# Patient Record
Sex: Female | Born: 1990 | Race: Black or African American | Hispanic: No | Marital: Single | Smoking: Former smoker
Health system: Southern US, Community
[De-identification: ages and names within clinical notes are randomized; demographics above are authoritative.]

## PROBLEM LIST (undated history)

## (undated) ENCOUNTER — Inpatient Hospital Stay: Payer: Self-pay

---
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
## 2008-03-14 ENCOUNTER — Ambulatory Visit: Payer: Self-pay | Admitting: Certified Nurse Midwife

## 2008-05-12 ENCOUNTER — Observation Stay: Payer: Self-pay | Admitting: Obstetrics and Gynecology

## 2008-05-13 ENCOUNTER — Inpatient Hospital Stay: Payer: Self-pay | Admitting: Obstetrics and Gynecology

## 2016-09-15 NOTE — L&D Delivery Note (Signed)
Delivery Note At  2325 a viable and healthy female "Teresa Kemp"  was delivered via  (Presentation:ROA ;  ).  APGAR: 8, 9  .   Placenta status: delivered intact with 3 vessel  Cord:  with the following complications: none  Anesthesia:  epidural Episiotomy:  none Lacerations:  none Est. Blood Loss (mL):  200  Mom to postpartum.  Baby to Couplet care / Skin to Skin.  Melody N Shambley 06/10/2017, 11:36 PM

## 2016-12-30 LAB — OB RESULTS CONSOLE VARICELLA ZOSTER ANTIBODY, IGG: Varicella: IMMUNE

## 2016-12-30 LAB — OB RESULTS CONSOLE ABO/RH: RH TYPE: POSITIVE

## 2016-12-30 LAB — OB RESULTS CONSOLE HGB/HCT, BLOOD
HCT: 34
Hemoglobin: 11.5

## 2016-12-30 LAB — OB RESULTS CONSOLE GC/CHLAMYDIA
CHLAMYDIA, DNA PROBE: NEGATIVE
GC PROBE AMP, GENITAL: NEGATIVE

## 2016-12-30 LAB — OB RESULTS CONSOLE HIV ANTIBODY (ROUTINE TESTING): HIV: NONREACTIVE

## 2016-12-30 LAB — OB RESULTS CONSOLE PLATELET COUNT: PLATELETS: 380

## 2016-12-30 LAB — OB RESULTS CONSOLE ANTIBODY SCREEN: Antibody Screen: NEGATIVE

## 2016-12-30 LAB — OB RESULTS CONSOLE RUBELLA ANTIBODY, IGM: Rubella: NON-IMMUNE/NOT IMMUNE

## 2017-02-20 ENCOUNTER — Encounter: Payer: Self-pay | Admitting: *Deleted

## 2017-02-20 ENCOUNTER — Observation Stay
Admission: EM | Admit: 2017-02-20 | Discharge: 2017-02-20 | Disposition: A | Discharge status: Home / Self Care | Payer: Medicaid Other | Attending: Obstetrics & Gynecology | Admitting: Obstetrics & Gynecology

## 2017-02-20 DIAGNOSIS — O4702 False labor before 37 completed weeks of gestation, second trimester: Secondary | ICD-10-CM | POA: Diagnosis not present

## 2017-02-20 DIAGNOSIS — R252 Cramp and spasm: Secondary | ICD-10-CM | POA: Diagnosis present

## 2017-02-20 DIAGNOSIS — Z3A24 24 weeks gestation of pregnancy: Secondary | ICD-10-CM | POA: Diagnosis not present

## 2017-02-20 LAB — FETAL FIBRONECTIN: Fetal Fibronectin: NEGATIVE

## 2017-02-20 MED ORDER — LACTATED RINGERS IV BOLUS (SEPSIS)
1000.0000 mL | Freq: Once | INTRAVENOUS | Status: AC
  Administered 2017-02-20: 1000 mL via INTRAVENOUS

## 2017-02-21 NOTE — Discharge Summary (Signed)
Wyatt Mageabitha Brynda RimM Joswick is a 26 y.o. female. She is at 9829w3d gestation. Patient's last menstrual period was 09/03/2016. Estimated Date of Delivery: 06/10/17  Prenatal care site:  [facility]   Chief complaint: cramping  Location: gravid uterus Onset/timing: yesterday Duration: 24hrs Quality: crampy Severity: mild Aggravating or alleviating conditions: nothing Associated signs/symptoms: no VB.no LOF,  Active fetal movement. Context: high risk pregnancy with history of 30wk delivery last pregnancy due to unknown etiology and patient declined 17-P intervention this pregnancy due to transportation issues.  Insufficient prenatal care History of preterm birth Marijuana use Elevated 1hr normal 3hr Rubella non-immune  Maternal Medical History:  No past medical history on file.  No past surgical history on file.  No Known Allergies  Prior to Admission medications   Not on File     Social History: +mj and    Family History: no history of gyn cancers  Review of Systems: A full review of systems was performed and negative except as noted in the HPI.     O:  Temp 98.1 F (36.7 C) (Oral)   Resp 16   LMP 09/03/2016  Results for orders placed or performed during the hospital encounter of 02/20/17 (from the past 48 hour(s))  Fetal fibronectin   Collection Time: 02/20/17 11:41 AM  Result Value Ref Range   Fetal Fibronectin NEGATIVE NEGATIVE     Constitutional: NAD, AAOx3  HE/ENT: extraocular movements grossly intact, moist mucous membranes CV: RRR PULM: nl respiratory effort, CTABL     Abd: gravid, non-tender, non-distended, soft      Ext: Non-tender, Nonedmeatous   Psych: mood appropriate, speech normal Pelvic closed/long/high by RN Craige CottaKirby  NST:  Baseline: 145 Variability: moderate Accelerations present x >2 (10x10 @ 24wks) Decelerations absent Time 20mins    A/P: 26 y.o. 6815w2d here for rule out preterm labor  Labor: not present. Fetal fibronectin negative  Fetal  Wellbeing: Reassuring Cat 1 tracing.  Reactive NST   D/c home stable, precautions reviewed, follow-up as scheduled.   ----- Ranae Plumberhelsea Ward, MD Attending Obstetrician and Gynecologist [facility], Department of OB/GYN [facility]

## 2017-03-27 ENCOUNTER — Ambulatory Visit (INDEPENDENT_AMBULATORY_CARE_PROVIDER_SITE_OTHER): Payer: Medicaid Other | Admitting: Certified Nurse Midwife

## 2017-03-27 ENCOUNTER — Encounter: Payer: Self-pay | Admitting: Certified Nurse Midwife

## 2017-03-27 VITALS — BP 126/75 | HR 98 | Ht 66.0 in | Wt 195.4 lb

## 2017-03-27 DIAGNOSIS — Z3493 Encounter for supervision of normal pregnancy, unspecified, third trimester: Secondary | ICD-10-CM

## 2017-03-27 LAB — POCT URINALYSIS DIPSTICK
Bilirubin, UA: NEGATIVE
Blood, UA: NEGATIVE
GLUCOSE UA: NEGATIVE
Ketones, UA: NEGATIVE
LEUKOCYTES UA: NEGATIVE
NITRITE UA: NEGATIVE
Protein, UA: NEGATIVE
Spec Grav, UA: 1.01 (ref 1.010–1.025)
UROBILINOGEN UA: 0.2 U/dL
pH, UA: 7 (ref 5.0–8.0)

## 2017-03-27 NOTE — Patient Instructions (Signed)

## 2017-03-27 NOTE — Progress Notes (Signed)
Pt is here for an OB visit.States she just transferred here [facility].

## 2017-03-27 NOTE — Progress Notes (Addendum)
TRANSFER IN OB HISTORY AND PHYSICAL  SUBJECTIVE:       Teresa Kemp is a 26 y.o. [id number] female, Patient's last menstrual period was 09/03/2016 (exact date)., Estimated Date of Delivery: 06/10/17, 5254w2d, presents today for Transition of Prenatal Care. EPIC data migration from outside records is accomplished today. Complaints today include: none   Gynecologic History Patient's last menstrual period was 09/03/2016 (exact date). Normal Contraception: none Last Pap: 01/29/2017. Results were: unknown. Need records  Obstetric History OB History  Gravida Para Term Preterm AB Living  2 1 0 1   1  SAB TAB Ectopic Multiple Live Births          1    # Outcome Date GA Lbr Len/2nd Weight Sex Delivery Anes PTL Lv  2 Current           1 Preterm 2009   3 lb 7 oz (1.559 kg) M Vag-Spont  Y LIV      History reviewed. No pertinent past medical history.  History reviewed. No pertinent surgical history.  No current outpatient prescriptions on file prior to visit.   No current facility-administered medications on file prior to visit.     No Known Allergies  Social History   Social History  . Marital status: Single    Spouse name: N/A  . Number of children: N/A  . Years of education: N/A   Occupational History  . Not on file.   Social History Main Topics  . Smoking status: Former Games developermoker  . Smokeless tobacco: Never Used  . Alcohol use No  . Drug use: No  . Sexual activity: Yes    Birth control/ protection: None   Other Topics Concern  . Not on file   Social History Narrative  . No narrative on file    Family History  Problem Relation Age of Onset  . Diabetes Father   . Diabetes Brother   . Diabetes Paternal Uncle   . Diabetes Paternal Grandmother   . Stroke Paternal Grandfather   . Diabetes Paternal Grandfather     The following portions of the patient's history were reviewed and updated as appropriate: allergies, current medications, past OB history, past medical  history, past surgical history, past family history, past social history, and problem list.   Pt states that she is no longer smoking or using marijuana .    OBJECTIVE: Initial Physical Exam (New OB)  GENERAL APPEARANCE: alert, well appearing, in no apparent distress, oriented to person, place and time HEAD: normocephalic, atraumatic MOUTH: mucous membranes moist, pharynx normal without lesions THYROID: no thyromegaly or masses present BREASTS: no masses noted, no significant tenderness, no palpable axillary nodes, no skin changes LUNGS: clear to auscultation, no wheezes, rales or rhonchi, symmetric air entry HEART: regular rate and rhythm, no murmurs ABDOMEN: soft, nontender, nondistended, no abnormal masses, no epigastric pain EXTREMITIES: no redness or tenderness in the calves or thighs SKIN: normal coloration and turgor, no rashes LYMPH NODES: no adenopathy palpable NEUROLOGIC: alert, oriented, normal speech, no focal findings or movement disorder noted  PELVIC EXAM EXTERNAL GENITALIA: not indicated at this time   LABS:  A pos Antibody negative Rubella -non immune HIV negative Quad screen: negative Urine culture negative GC/Chlamydia negative HBsAg negative Varicella Zoster immune sickle cell negative  Need: pap results.   ASSESSMENT: Normal pregnancy  PLAN: Prenatal care course reviewed. Pt requesting ultrasound for pictures. Explained that it is not medically necessary. Information given on prenatal peak. Discussed use of 17 P  due to hx. Of preterm delivery. She is declining at this time due to transportation issues. Marylyn from pregnancy home informed to help identify additional resources to get her to the office weekly.  Prenatal records from the health department reviewed. Some of the Lab results are not present with records. Will request lab results. PTL precaution reviewed. Follow up 2 wks.   Doreene Burke, CNM  See orders

## 2017-04-10 ENCOUNTER — Encounter: Payer: Self-pay | Admitting: Certified Nurse Midwife

## 2017-04-10 ENCOUNTER — Ambulatory Visit (INDEPENDENT_AMBULATORY_CARE_PROVIDER_SITE_OTHER): Payer: Medicaid Other | Admitting: Certified Nurse Midwife

## 2017-04-10 VITALS — BP 133/85 | HR 107 | Wt 201.2 lb

## 2017-04-10 DIAGNOSIS — Z87898 Personal history of other specified conditions: Secondary | ICD-10-CM

## 2017-04-10 DIAGNOSIS — F1291 Cannabis use, unspecified, in remission: Secondary | ICD-10-CM | POA: Insufficient documentation

## 2017-04-10 DIAGNOSIS — O0933 Supervision of pregnancy with insufficient antenatal care, third trimester: Secondary | ICD-10-CM | POA: Insufficient documentation

## 2017-04-10 DIAGNOSIS — Z2839 Other underimmunization status: Secondary | ICD-10-CM | POA: Insufficient documentation

## 2017-04-10 DIAGNOSIS — Z3493 Encounter for supervision of normal pregnancy, unspecified, third trimester: Secondary | ICD-10-CM

## 2017-04-10 DIAGNOSIS — O99810 Abnormal glucose complicating pregnancy: Secondary | ICD-10-CM

## 2017-04-10 DIAGNOSIS — O9989 Other specified diseases and conditions complicating pregnancy, childbirth and the puerperium: Secondary | ICD-10-CM

## 2017-04-10 DIAGNOSIS — Z23 Encounter for immunization: Secondary | ICD-10-CM | POA: Diagnosis not present

## 2017-04-10 DIAGNOSIS — O09893 Supervision of other high risk pregnancies, third trimester: Secondary | ICD-10-CM

## 2017-04-10 DIAGNOSIS — O09219 Supervision of pregnancy with history of pre-term labor, unspecified trimester: Secondary | ICD-10-CM

## 2017-04-10 DIAGNOSIS — O09899 Supervision of other high risk pregnancies, unspecified trimester: Secondary | ICD-10-CM | POA: Insufficient documentation

## 2017-04-10 DIAGNOSIS — Z8659 Personal history of other mental and behavioral disorders: Secondary | ICD-10-CM

## 2017-04-10 DIAGNOSIS — Z283 Underimmunization status: Secondary | ICD-10-CM

## 2017-04-10 DIAGNOSIS — O09213 Supervision of pregnancy with history of pre-term labor, third trimester: Secondary | ICD-10-CM

## 2017-04-10 LAB — POCT URINALYSIS DIPSTICK
Bilirubin, UA: NEGATIVE
Blood, UA: NEGATIVE
GLUCOSE UA: NEGATIVE
Ketones, UA: NEGATIVE
Leukocytes, UA: NEGATIVE
NITRITE UA: NEGATIVE
Protein, UA: NEGATIVE
Spec Grav, UA: 1.025 (ref 1.010–1.025)
UROBILINOGEN UA: 0.2 U/dL
pH, UA: 5 (ref 5.0–8.0)

## 2017-04-10 NOTE — Progress Notes (Signed)
Pt is here for an ROB visit. C/o cramps. Tdap given

## 2017-04-10 NOTE — Patient Instructions (Addendum)
Third Trimester of Pregnancy The third trimester is from week 28 through week 40 (months 7 through 9). The third trimester is a time when the unborn baby (fetus) is growing rapidly. At the end of the ninth month, the fetus is about 20 inches in length and weighs 6-10 pounds. Body changes during your third trimester Your body will continue to go through many changes during pregnancy. The changes vary from woman to woman. During the third trimester:  Your weight will continue to increase. You can expect to gain 25-35 pounds (11-16 kg) by the end of the pregnancy.  You may begin to get stretch marks on your hips, abdomen, and breasts.  You may urinate more often because the fetus is moving lower into your pelvis and pressing on your bladder.  You may develop or continue to have heartburn. This is caused by increased hormones that slow down muscles in the digestive tract.  You may develop or continue to have constipation because increased hormones slow digestion and cause the muscles that push waste through your intestines to relax.  You may develop hemorrhoids. These are swollen veins (varicose veins) in the rectum that can itch or be painful.  You may develop swollen, bulging veins (varicose veins) in your legs.  You may have increased body aches in the pelvis, back, or thighs. This is due to weight gain and increased hormones that are relaxing your joints.  You may have changes in your hair. These can include thickening of your hair, rapid growth, and changes in texture. Some women also have hair loss during or after pregnancy, or hair that feels dry or thin. Your hair will most likely return to normal after your baby is born.  Your breasts will continue to grow and they will continue to become tender. A yellow fluid (colostrum) may leak from your breasts. This is the first milk you are producing for your baby.  Your belly button may stick out.  You may notice more swelling in your hands,  face, or ankles.  You may have increased tingling or numbness in your hands, arms, and legs. The skin on your belly may also feel numb.  You may feel short of breath because of your expanding uterus.  You may have more problems sleeping. This can be caused by the size of your belly, increased need to urinate, and an increase in your body's metabolism.  You may notice the fetus "dropping," or moving lower in your abdomen (lightening).  You may have increased vaginal discharge.  You may notice your joints feel loose and you may have pain around your pelvic bone.  What to expect at prenatal visits You will have prenatal exams every 2 weeks until week 36. Then you will have weekly prenatal exams. During a routine prenatal visit:  You will be weighed to make sure you and the baby are growing normally.  Your blood pressure will be taken.  Your abdomen will be measured to track your baby's growth.  The fetal heartbeat will be listened to.  Any test results from the previous visit will be discussed.  You may have a cervical check near your due date to see if your cervix has softened or thinned (effaced).  You will be tested for Group B streptococcus. This happens between 35 and 37 weeks.  Your health care provider may ask you:  What your birth plan is.  How you are feeling.  If you are feeling the baby move.  If you have had   any abnormal symptoms, such as leaking fluid, bleeding, severe headaches, or abdominal cramping.  If you are using any tobacco products, including cigarettes, chewing tobacco, and electronic cigarettes.  If you have any questions.  Other tests or screenings that may be performed during your third trimester include:  Blood tests that check for low iron levels (anemia).  Fetal testing to check the health, activity level, and growth of the fetus. Testing is done if you have certain medical conditions or if there are problems during the  pregnancy.  Nonstress test (NST). This test checks the health of your baby to make sure there are no signs of problems, such as the baby not getting enough oxygen. During this test, a belt is placed around your belly. The baby is made to move, and its heart rate is monitored during movement.  What is false labor? False labor is a condition in which you feel small, irregular tightenings of the muscles in the womb (contractions) that usually go away with rest, changing position, or drinking water. These are called Braxton Hicks contractions. Contractions may last for hours, days, or even weeks before true labor sets in. If contractions come at regular intervals, become more frequent, increase in intensity, or become painful, you should see your health care provider. What are the signs of labor?  Abdominal cramps.  Regular contractions that start at 10 minutes apart and become stronger and more frequent with time.  Contractions that start on the top of the uterus and spread down to the lower abdomen and back.  Increased pelvic pressure and dull back pain.  A watery or bloody mucus discharge that comes from the vagina.  Leaking of amniotic fluid. This is also known as your "water breaking." It could be a slow trickle or a gush. Let your health care provider know if it has a color or strange odor. If you have any of these signs, call your health care provider right away, even if it is before your due date. Follow these instructions at home: Medicines  Follow your health care provider's instructions regarding medicine use. Specific medicines may be either safe or unsafe to take during pregnancy.  Take a prenatal vitamin that contains at least 600 micrograms (mcg) of folic acid.  If you develop constipation, try taking a stool softener if your health care provider approves. Eating and drinking  Eat a balanced diet that includes fresh fruits and vegetables, whole grains, good sources of protein  such as meat, eggs, or tofu, and low-fat dairy. Your health care provider will help you determine the amount of weight gain that is right for you.  Avoid raw meat and uncooked cheese. These carry germs that can cause birth defects in the baby.  If you have low calcium intake from food, talk to your health care provider about whether you should take a daily calcium supplement.  Eat four or five small meals rather than three large meals a day.  Limit foods that are high in fat and processed sugars, such as fried and sweet foods.  To prevent constipation: ? Drink enough fluid to keep your urine clear or pale yellow. ? Eat foods that are high in fiber, such as fresh fruits and vegetables, whole grains, and beans. Activity  Exercise only as directed by your health care provider. Most women can continue their usual exercise routine during pregnancy. Try to exercise for 30 minutes at least 5 days a week. Stop exercising if you experience uterine contractions.  Avoid heavy   lifting.  Do not exercise in extreme heat or humidity, or at high altitudes.  Wear low-heel, comfortable shoes.  Practice good posture.  You may continue to have sex unless your health care provider tells you otherwise. Relieving pain and discomfort  Take frequent breaks and rest with your legs elevated if you have leg cramps or low back pain.  Take warm sitz baths to soothe any pain or discomfort caused by hemorrhoids. Use hemorrhoid cream if your health care provider approves.  Wear a good support bra to prevent discomfort from breast tenderness.  If you develop varicose veins: ? Wear support pantyhose or compression stockings as told by your healthcare provider. ? Elevate your feet for 15 minutes, 3-4 times a day. Prenatal care  Write down your questions. Take them to your prenatal visits.  Keep all your prenatal visits as told by your health care provider. This is important. Safety  Wear your seat belt at  all times when driving.  Make a list of emergency phone numbers, including numbers for family, friends, the hospital, and police and fire departments. General instructions  Avoid cat litter boxes and soil used by cats. These carry germs that can cause birth defects in the baby. If you have a cat, ask someone to clean the litter box for you.  Do not travel far distances unless it is absolutely necessary and only with the approval of your health care provider.  Do not use hot tubs, steam rooms, or saunas.  Do not drink alcohol.  Do not use any products that contain nicotine or tobacco, such as cigarettes and e-cigarettes. If you need help quitting, ask your health care provider.  Do not use any medicinal herbs or unprescribed drugs. These chemicals affect the formation and growth of the baby.  Do not douche or use tampons or scented sanitary pads.  Do not cross your legs for long periods of time.  To prepare for the arrival of your baby: ? Take prenatal classes to understand, practice, and ask questions about labor and delivery. ? Make a trial run to the hospital. ? Visit the hospital and tour the maternity area. ? Arrange for maternity or paternity leave through employers. ? Arrange for family and friends to take care of pets while you are in the hospital. ? Purchase a rear-facing car seat and make sure you know how to install it in your car. ? Pack your hospital bag. ? Prepare the baby's nursery. Make sure to remove all pillows and stuffed animals from the baby's crib to prevent suffocation.  Visit your dentist if you have not gone during your pregnancy. Use a soft toothbrush to brush your teeth and be gentle when you floss. Contact a health care provider if:  You are unsure if you are in labor or if your water has broken.  You become dizzy.  You have mild pelvic cramps, pelvic pressure, or nagging pain in your abdominal area.  You have lower back pain.  You have persistent  nausea, vomiting, or diarrhea.  You have an unusual or bad smelling vaginal discharge.  You have pain when you urinate. Get help right away if:  Your water breaks before 37 weeks.  You have regular contractions less than 5 minutes apart before 37 weeks.  You have a fever.  You are leaking fluid from your vagina.  You have spotting or bleeding from your vagina.  You have severe abdominal pain or cramping.  You have rapid weight loss or weight gain.    You have shortness of breath with chest pain.  You notice sudden or extreme swelling of your face, hands, ankles, feet, or legs.  Your baby makes fewer than 10 movements in 2 hours.  You have severe headaches that do not go away when you take medicine.  You have vision changes. Summary  The third trimester is from week 28 through week 40, months 7 through 9. The third trimester is a time when the unborn baby (fetus) is growing rapidly.  During the third trimester, your discomfort may increase as you and your baby continue to gain weight. You may have abdominal, leg, and back pain, sleeping problems, and an increased need to urinate.  During the third trimester your breasts will keep growing and they will continue to become tender. A yellow fluid (colostrum) may leak from your breasts. This is the first milk you are producing for your baby.  False labor is a condition in which you feel small, irregular tightenings of the muscles in the womb (contractions) that eventually go away. These are called Braxton Hicks contractions. Contractions may last for hours, days, or even weeks before true labor sets in.  Signs of labor can include: abdominal cramps; regular contractions that start at 10 minutes apart and become stronger and more frequent with time; watery or bloody mucus discharge that comes from the vagina; increased pelvic pressure and dull back pain; and leaking of amniotic fluid. This information is not intended to replace advice  given to you by your health care provider. Make sure you discuss any questions you have with your health care provider. Document Released: 08/26/2001 Document Revised: 02/07/2016 Document Reviewed: 11/02/2012 Elsevier Interactive Patient Education  2017 Elsevier Inc. DTaP Vaccine (Diphtheria, Tetanus, and Pertussis): What You Need to Know 1. Why get vaccinated? Diphtheria, tetanus, and pertussis are serious diseases caused by bacteria. Diphtheria and pertussis are spread from person to person. Tetanus enters the body through cuts or wounds. DIPHTHERIA causes a thick covering in the back of the throat.  It can lead to breathing problems, paralysis, heart failure, and even death.  TETANUS (Lockjaw) causes painful tightening of the muscles, usually all over the body.  It can lead to "locking" of the jaw so the victim cannot open his mouth or swallow. Tetanus leads to death in up to 2 out of 10 cases.  PERTUSSIS (Whooping Cough) causes coughing spells so bad that it is hard for infants to eat, drink, or breathe. These spells can last for weeks.  It can lead to pneumonia, seizures (jerking and staring spells), brain damage, and death.  Diphtheria, tetanus, and pertussis vaccine (DTaP) can help prevent these diseases. Most children who are vaccinated with DTaP will be protected throughout childhood. Many more children would get these diseases if we stopped vaccinating. DTaP is a safer version of an older vaccine called DTP. DTP is no longer used in the [place]. 2. Who should get DTaP vaccine and when? Children should get 5 doses of DTaP vaccine, one dose at each of the following ages:  2 months  4 months  6 months  15-18 months  4-6 years  DTaP may be given at the same time as other vaccines. 3. Some children should not get DTaP vaccine or should wait  Children with minor illnesses, such as a cold, may be vaccinated. But children who are moderately or severely ill should usually  wait until they recover before getting DTaP vaccine.  Any child who had a life-threatening allergic reaction after  a dose of DTaP should not get another dose.  Any child who suffered a brain or nervous system disease within 7 days after a dose of DTaP should not get another dose.  Talk with your doctor if your child: ? had a seizure or collapsed after a dose of DTaP, ? cried non-stop for 3 hours or more after a dose of DTaP, ? had a fever over 105F after a dose of DTaP. Ask your doctor for more information. Some of these children should not get another dose of pertussis vaccine, but may get a vaccine without pertussis, called DT. 4. Older children and adults DTaP is not licensed for adolescents, adults, or children 59 years of age and older. But older people still need protection. A vaccine called Tdap is similar to DTaP. A single dose of Tdap is recommended for people 11 through 28 years of age. Another vaccine, called Td, protects against tetanus and diphtheria, but not pertussis. It is recommended every 10 years. There are separate Vaccine Information Statements for these vaccines. 5. What are the risks from DTaP vaccine? Getting diphtheria, tetanus, or pertussis disease is much riskier than getting DTaP vaccine. However, a vaccine, like any medicine, is capable of causing serious problems, such as severe allergic reactions. The risk of DTaP vaccine causing serious harm, or death, is extremely small. Mild problems (common)  Fever (up to about 1 child in 4)  Redness or swelling where the shot was given (up to about 1 child in 4)  Soreness or tenderness where the shot was given (up to about 1 child in 4) These problems occur more often after the 4th and 5th doses of the DTaP series than after earlier doses. Sometimes the 4th or 5th dose of DTaP vaccine is followed by swelling of the entire arm or leg in which the shot was given, lasting 1-7 days (up to about 1 child in 30). Other mild  problems include:  Fussiness (up to about 1 child in 3)  Tiredness or poor appetite (up to about 1 child in 10)  Vomiting (up to about 1 child in 50) These problems generally occur 1-3 days after the shot. Moderate problems (uncommon)  Seizure (jerking or staring) (about 1 child out of 14,000)  Non-stop crying, for 3 hours or more (up to about 1 child out of 1,000)  High fever, over 105F (about 1 child out of 16,000) Severe problems (very rare)  Serious allergic reaction (less than 1 out of a million doses)  Several other severe problems have been reported after DTaP vaccine. These include: ? Long-term seizures, coma, or lowered consciousness ? Permanent brain damage. These are so rare it is hard to tell if they are caused by the vaccine. Controlling fever is especially important for children who have had seizures, for any reason. It is also important if another family member has had seizures. You can reduce fever and pain by giving your child an aspirin-free pain reliever when the shot is given, and for the next 24 hours, following the package instructions. 6. What if there is a serious reaction? What should I look for? Look for anything that concerns you, such as signs of a severe allergic reaction, very high fever, or behavior changes. Signs of a severe allergic reaction can include hives, swelling of the face and throat, difficulty breathing, a fast heartbeat, dizziness, and weakness. These would start a few minutes to a few hours after the vaccination. What should I do?  If you think  it is a severe allergic reaction or other emergency that can't wait, call 9-1-1 or get the person to the nearest hospital. Otherwise, call your doctor.  Afterward, the reaction should be reported to the Vaccine Adverse Event Reporting System (VAERS). Your doctor might file this report, or you can do it yourself through the VAERS web site at www.vaers.LAgents.nohhs.gov, or by calling 1-(323)174-4167. ? VAERS  is only for reporting reactions. They do not give medical advice. 7. The National Vaccine Injury Compensation Program The [employer] Vaccine Injury Compensation Program (VICP) is a federal program that was created to compensate people who may have been injured by certain vaccines. Persons who believe they may have been injured by a vaccine can learn about the program and about filing a claim by calling 1-226-813-9935 or visiting the VICP website at SpiritualWord.atwww.hrsa.gov/vaccinecompensation. 8. How can I learn more?  Ask your doctor.  Call your local or state health department.  Contact the Centers for Disease Control and Prevention (CDC): ? Call 417-856-42271-757-707-8274 (1-800-CDC-INFO) or ? Visit CDC's website at PicCapture.uywww.cdc.gov/vaccines CDC DTaP Vaccine (Diphtheria, Tetanus, and Pertussis) VIS (01/29/06) This information is not intended to replace advice given to you by your health care provider. Make sure you discuss any questions you have with your health care provider. Document Released: 06/29/2006 Document Revised: 05/22/2016 Document Reviewed: 05/22/2016 Elsevier Interactive Patient Education  2017 [employer] [facility] 2018 Prenatal Education Class Schedule Register at [email or web address] in the Classes & Resources Link or call Mardi Mainland[facility]Well Line at [phone] 9:00a-5:00p M-F  Childbirth Preparation Certified Childbirth Educators teach this 5 week course.  Expectant parents are encouraged to take this class in their 3rd trimester, completing it by their 35-36th week. Meets in [facility], Lower Level.  Mondays Thursdays  7:00-9:00 p 7:00-9:00 p  July 23 - August 20 July 19 - August 16  September 17 - October 15 September 6 -October 4  November 5 - December 3 October 25 - November 29   No Class on Thanksgiving Day -November 22  Childbirth Preparation Refresher Course For those who have previously attended Prepared Childbirth Preparation classes, this class in incorporated  into the 3rd and 4th classes in the Monday night childbirth series.  Course meets in the [facility]. Lower Level from 7:00p - 9:00p  August 6 & 13  October 1 & 8  November 19 & 26   Weekend Childbirth [facility] Classes are held Saturday & Sunday, 1:00 5:00p Course meets in [facility], [place]  August 4 & 5  November 3 & 4    The [address] [facility] Free tours are held on the third Sunday of each month at 3 pm.  The tour meets in the third floor waiting area and will take approximately 30 minutes.  Tours are also included in Childbirth class series as well as Brother/Sister class.  An online virtual tour can be seen at [email or web address].         Breastfeeding & Infant Nutrition The course incorporates returning to work or school.  Breast milk collection and storage with basic breastfeeding and infant nutrition. This two-class course is held the 2nd and 3rd Tuesday of each month from 7:00 -9:00 pm.  Course meets in the [facility] Lower level  June 12 & 19 July-No Class  August 14 & 21 September 11 &18  September 11 & 18 October 9 &16  November 13 & 20 December 11 & 18   [facility] [facility] [employer] welcomes any mother  for a social outing with other Moms to share experiences and challenges in an informal setting.  Meets the 1st Thursday and 3rd Thursday 11:30a-1:00 pm of each month in [facility] 3rd floor classroom.  No registration required.  Newborn Essentials This course covers bathing, diapering, swaddling and more with practice on lifelike dolls.  Participants will also learn safety tips and infant CPR (Not for certification).  It is held the 1st Wednesday of each month from 7:00p-9:00p in the [facility], Lower level.  June 6 July- No Class  August 1 September 5  October 3 November 7  December 7    Preparing Big Brother & Sister This one session course prepares children and their parents for the arrival of a new baby.  It is held on  the 1st Tuesday of each month from 6:30p - 8:00p. Course meets in the [facility], Lower level.  July-No Class August 7  September 4 October 2  November 6 December 4   [facility] [employer] D[employer] This nationally acclaimed class helps expecting and new dads with the basic skills and confidence to bond with their infants, support their mates, and provide a safe and healthy home environment for their new family. Classes are held the 2nd Saturday of every month from 9:00a - 12:00 noon.  Course meets in the [facility] Lower level.  June 9 August 11  October 13 No Class in December

## 2017-04-10 NOTE — Progress Notes (Signed)
ROB-Pt doing well pregnancy wise, argument with FOB in waiting room prior to today's visit. FOB not present for visit. Pt denies safety concerns or fear of going home. TDaP given today. Blood transfusion consent reviewed and signed. Plans breastfeeding and breastfeeding class schedule given. Encouraged used of lactation support. Female and vertex by US. Reviewed red flag symptoms and when to call. RTC x 2 weeks for ROB or sooner if needed.

## 2017-04-24 ENCOUNTER — Encounter: Payer: Medicaid Other | Admitting: Certified Nurse Midwife

## 2017-05-08 ENCOUNTER — Encounter: Payer: Self-pay | Admitting: Certified Nurse Midwife

## 2017-05-08 ENCOUNTER — Ambulatory Visit (INDEPENDENT_AMBULATORY_CARE_PROVIDER_SITE_OTHER): Payer: Medicaid Other | Admitting: Certified Nurse Midwife

## 2017-05-08 VITALS — BP 107/82 | HR 97 | Wt 207.6 lb

## 2017-05-08 DIAGNOSIS — Z3493 Encounter for supervision of normal pregnancy, unspecified, third trimester: Secondary | ICD-10-CM

## 2017-05-08 LAB — POCT URINALYSIS DIPSTICK
Bilirubin, UA: NEGATIVE
Blood, UA: NEGATIVE
GLUCOSE UA: NEGATIVE
KETONES UA: NEGATIVE
Leukocytes, UA: NEGATIVE
Nitrite, UA: NEGATIVE
Protein, UA: NEGATIVE
Urobilinogen, UA: 0.2 E.U./dL
pH, UA: 7.5 (ref 5.0–8.0)

## 2017-05-08 NOTE — Patient Instructions (Signed)
Group B Streptococcus Infection During Pregnancy Group B Streptococcus (GBS) is a type of bacteria (Streptococcus agalactiae) that is often found in healthy people, commonly in the rectum, vagina, and intestines. In people who are healthy and not pregnant, the bacteria rarely cause serious illness or complications. However, women who test positive for GBS during pregnancy can pass the bacteria to their baby during childbirth, which can cause serious infection in the baby after birth. Women with GBS may also have infections during their pregnancy or immediately after childbirth, such as such as urinary tract infections (UTIs) or infections of the uterus (uterine infections). Having GBS also increases a woman's risk of complications during pregnancy, such as early (preterm) labor or delivery, miscarriage, or stillbirth. Routine testing (screening) for GBS is recommended for all pregnant women. What increases the risk? You may have a higher risk for GBS infection during pregnancy if you had one during a past pregnancy. What are the signs or symptoms? In most cases, GBS infection does not cause symptoms in pregnant women. Signs and symptoms of a possible GBS-related infection may include:  Labor starting before the 37th week of pregnancy.  A UTI or bladder infection, which may cause: ? Fever. ? Pain or burning during urination. ? Frequent urination.  Fever during labor, along with: ? Bad-smelling discharge. ? Uterine tenderness. ? Rapid heartbeat in the mother, baby, or both.  Rare but serious symptoms of a possible GBS-related infection in women include:  Blood infection (septicemia). This may cause fever, chills, or confusion.  Lung infection (pneumonia). This may cause fever, chills, cough, rapid breathing, difficulty breathing, or chest pain.  Bone, joint, skin, or soft tissue infection.  How is this diagnosed? You may be screened for GBS between week 35 and week 37 of your pregnancy. If  you have symptoms of preterm labor, you may be screened earlier. This condition is diagnosed based on lab test results from:  A swab of fluid from the vagina and rectum.  A urine sample.  How is this treated? This condition is treated with antibiotic medicine. When you go into labor, or as soon as your water breaks (your membranes rupture), you will be given antibiotics through an IV tube. Antibiotics will continue until after you give birth. If you are having a cesarean delivery, you do not need antibiotics unless your membranes have already ruptured. Follow these instructions at home:  Take over-the-counter and prescription medicines only as told by your health care provider.  Take your antibiotic medicine as told by your health care provider. Do not stop taking the antibiotic even if you start to feel better.  Keep all pre-birth (prenatal) visits and follow-up visits as told by your health care provider. This is important. Contact a health care provider if:  You have pain or burning when you urinate.  You have to urinate frequently.  You have a fever or chills.  You develop a bad-smelling vaginal discharge. Get help right away if:  Your membranes rupture.  You go into labor.  You have severe pain in your abdomen.  You have difficulty breathing.  You have chest pain. This information is not intended to replace advice given to you by your health care provider. Make sure you discuss any questions you have with your health care provider. Document Released: 12/09/2007 Document Revised: 03/28/2016 Document Reviewed: 03/27/2016 Elsevier Interactive Patient Education  2018 [employer]vier Inc[employer]  

## 2017-05-08 NOTE — Progress Notes (Signed)
Pt is here for a routine OB visit. Right leg gets tingly and numb to her ankles.

## 2017-05-08 NOTE — Progress Notes (Signed)
ROB doing well. Complains of back discomfort and some swelling. No swelling today on exam. Discussed use of stocking and belly band. Encouraged PO hydration and tylenol as needed. Note given today for work - frequent breaks. Reviewed GBS at next visit. She will return in one week.  Doreene Burke, CNM

## 2017-05-15 ENCOUNTER — Encounter: Payer: Medicaid Other | Admitting: Certified Nurse Midwife

## 2017-05-19 ENCOUNTER — Ambulatory Visit (INDEPENDENT_AMBULATORY_CARE_PROVIDER_SITE_OTHER): Payer: Medicaid Other | Admitting: Certified Nurse Midwife

## 2017-05-19 ENCOUNTER — Encounter: Payer: Self-pay | Admitting: Certified Nurse Midwife

## 2017-05-19 VITALS — BP 117/79 | HR 79 | Wt 209.3 lb

## 2017-05-19 DIAGNOSIS — Z369 Encounter for antenatal screening, unspecified: Secondary | ICD-10-CM

## 2017-05-19 DIAGNOSIS — Z3493 Encounter for supervision of normal pregnancy, unspecified, third trimester: Secondary | ICD-10-CM

## 2017-05-19 DIAGNOSIS — Z113 Encounter for screening for infections with a predominantly sexual mode of transmission: Secondary | ICD-10-CM

## 2017-05-19 LAB — POCT URINALYSIS DIPSTICK
Bilirubin, UA: NEGATIVE
Blood, UA: NEGATIVE
Glucose, UA: NEGATIVE
KETONES UA: NEGATIVE
LEUKOCYTES UA: NEGATIVE
Nitrite, UA: NEGATIVE
PH UA: 7.5 (ref 5.0–8.0)
PROTEIN UA: NEGATIVE
Spec Grav, UA: 1.01 (ref 1.010–1.025)
Urobilinogen, UA: 0.2 E.U./dL

## 2017-05-19 NOTE — Patient Instructions (Signed)

## 2017-05-19 NOTE — Progress Notes (Signed)
ROB doing well. No complaints. GBS, GC/chlamydia today. SVE per pt request. Labor precautions reviewed. Follow up 1 wk  Doreene BurkeAnnie Damarys Speir, CNM

## 2017-05-20 ENCOUNTER — Other Ambulatory Visit: Payer: Self-pay

## 2017-05-20 NOTE — Addendum Note (Signed)
Addended by: Jackquline DenmarkIDGEWAY, Andalyn Heckstall W on: 05/20/2017 09:05 AM   Modules accepted: Orders

## 2017-05-22 LAB — GC/CHLAMYDIA PROBE AMP
Chlamydia trachomatis, NAA: NEGATIVE
NEISSERIA GONORRHOEAE BY PCR: NEGATIVE

## 2017-05-24 LAB — CULTURE, BETA STREP (GROUP B ONLY): STREP GP B CULTURE: NEGATIVE

## 2017-05-25 ENCOUNTER — Encounter: Payer: Self-pay | Admitting: Certified Nurse Midwife

## 2017-05-26 ENCOUNTER — Encounter: Payer: Self-pay | Admitting: Certified Nurse Midwife

## 2017-05-26 ENCOUNTER — Ambulatory Visit (INDEPENDENT_AMBULATORY_CARE_PROVIDER_SITE_OTHER): Payer: Medicaid Other | Admitting: Certified Nurse Midwife

## 2017-05-26 VITALS — BP 119/74 | HR 85 | Wt 212.0 lb

## 2017-05-26 DIAGNOSIS — Z3493 Encounter for supervision of normal pregnancy, unspecified, third trimester: Secondary | ICD-10-CM

## 2017-05-26 LAB — POCT URINALYSIS DIPSTICK
BILIRUBIN UA: NEGATIVE
Blood, UA: NEGATIVE
Glucose, UA: NEGATIVE
KETONES UA: NEGATIVE
Nitrite, UA: NEGATIVE
PH UA: 7 (ref 5.0–8.0)
Protein, UA: NEGATIVE
Spec Grav, UA: 1.01 (ref 1.010–1.025)
Urobilinogen, UA: 0.2 E.U./dL

## 2017-05-26 NOTE — Patient Instructions (Signed)
Vaginal Delivery Vaginal delivery means that you will give birth by pushing your baby out of your birth canal (vagina). A team of health care providers will help you before, during, and after vaginal delivery. Birth experiences are unique for every woman and every pregnancy, and birth experiences vary depending on where you choose to give birth. What should I do to prepare for my baby's birth? Before your baby is born, it is important to talk with your health care provider about:  Your labor and delivery preferences. These may include: ? Medicines that you may be given. ? How you will manage your pain. This might include non-medical pain relief techniques or injectable pain relief such as epidural analgesia. ? How you and your baby will be monitored during labor and delivery. ? Who may be in the labor and delivery room with you. ? Your feelings about surgical delivery of your baby (cesarean delivery, or C-section) if this becomes necessary. ? Your feelings about receiving donated blood through an IV tube (blood transfusion) if this becomes necessary.  Whether you are able: ? To take pictures or videos of the birth. ? To eat during labor and delivery. ? To move around, walk, or change positions during labor and delivery.  What to expect after your baby is born, such as: ? Whether delayed umbilical cord clamping and cutting is offered. ? Who will care for your baby right after birth. ? Medicines or tests that may be recommended for your baby. ? Whether breastfeeding is supported in your hospital or birth center. ? How long you will be in the hospital or birth center.  How any medical conditions you have may affect your baby or your labor and delivery experience.  To prepare for your baby's birth, you should also:  Attend all of your health care visits before delivery (prenatal visits) as recommended by your health care provider. This is important.  Prepare your home for your baby's  arrival. Make sure that you have: ? Diapers. ? Baby clothing. ? Feeding equipment. ? Safe sleeping arrangements for you and your baby.  Install a car seat in your vehicle. Have your car seat checked by a certified car seat installer to make sure that it is installed safely.  Think about who will help you with your new baby at home for at least the first several weeks after delivery.  What can I expect when I arrive at the birth center or hospital? Once you are in labor and have been admitted into the hospital or birth center, your health care provider may:  Review your pregnancy history and any concerns you have.  Insert an IV tube into one of your veins. This is used to give you fluids and medicines.  Check your blood pressure, pulse, temperature, and heart rate (vital signs).  Check whether your bag of water (amniotic sac) has broken (ruptured).  Talk with you about your birth plan and discuss pain control options.  Monitoring Your health care provider may monitor your contractions (uterine monitoring) and your baby's heart rate (fetal monitoring). You may need to be monitored:  Often, but not continuously (intermittently).  All the time or for long periods at a time (continuously). Continuous monitoring may be needed if: ? You are taking certain medicines, such as medicine to relieve pain or make your contractions stronger. ? You have pregnancy or labor complications.  Monitoring may be done by:  Placing a special stethoscope or a handheld monitoring device on your abdomen to   check your baby's heartbeat, and feeling your abdomen for contractions. This method of monitoring does not continuously record your baby's heartbeat or your contractions.  Placing monitors on your abdomen (external monitors) to record your baby's heartbeat and the frequency and length of contractions. You may not have to wear external monitors all the time.  Placing monitors inside of your uterus  (internal monitors) to record your baby's heartbeat and the frequency, length, and strength of your contractions. ? Your health care provider may use internal monitors if he or she needs more information about the strength of your contractions or your baby's heart rate. ? Internal monitors are put in place by passing a thin, flexible wire through your vagina and into your uterus. Depending on the type of monitor, it may remain in your uterus or on your baby's head until birth. ? Your health care provider will discuss the benefits and risks of internal monitoring with you and will ask for your permission before inserting the monitors.  Telemetry. This is a type of continuous monitoring that can be done with external or internal monitors. Instead of having to stay in bed, you are able to move around during telemetry. Ask your health care provider if telemetry is an option for you.  Physical exam Your health care provider may perform a physical exam. This may include:  Checking whether your baby is positioned: ? With the head toward your vagina (head-down). This is most common. ? With the head toward the top of your uterus (head-up or breech). If your baby is in a breech position, your health care provider may try to turn your baby to a head-down position so you can deliver vaginally. If it does not seem that your baby can be born vaginally, your provider may recommend surgery to deliver your baby. In rare cases, you may be able to deliver vaginally if your baby is head-up (breech delivery). ? Lying sideways (transverse). Babies that are lying sideways cannot be delivered vaginally.  Checking your cervix to determine: ? Whether it is thinning out (effacing). ? Whether it is opening up (dilating). ? How low your baby has moved into your birth canal.  What are the three stages of labor and delivery?  Normal labor and delivery is divided into the following three stages: Stage 1  Stage 1 is the  longest stage of labor, and it can last for hours or days. Stage 1 includes: ? Early labor. This is when contractions may be irregular, or regular and mild. Generally, early labor contractions are more than 10 minutes apart. ? Active labor. This is when contractions get longer, more regular, more frequent, and more intense. ? The transition phase. This is when contractions happen very close together, are very intense, and may last longer than during any other part of labor.  Contractions generally feel mild, infrequent, and irregular at first. They get stronger, more frequent (about every 2-3 minutes), and more regular as you progress from early labor through active labor and transition.  Many women progress through stage 1 naturally, but you may need help to continue making progress. If this happens, your health care provider may talk with you about: ? Rupturing your amniotic sac if it has not ruptured yet. ? Giving you medicine to help make your contractions stronger and more frequent.  Stage 1 ends when your cervix is completely dilated to 4 inches (10 cm) and completely effaced. This happens at the end of the transition phase. Stage 2  Once   your cervix is completely effaced and dilated to 4 inches (10 cm), you may start to feel an urge to push. It is common for the body to naturally take a rest before feeling the urge to push, especially if you received an epidural or certain other pain medicines. This rest period may last for up to 1-2 hours, depending on your unique labor experience.  During stage 2, contractions are generally less painful, because pushing helps relieve contraction pain. Instead of contraction pain, you may feel stretching and burning pain, especially when the widest part of your baby's head passes through the vaginal opening (crowning).  Your health care provider will closely monitor your pushing progress and your baby's progress through the vagina during stage 2.  Your  health care provider may massage the area of skin between your vaginal opening and anus (perineum) or apply warm compresses to your perineum. This helps it stretch as the baby's head starts to crown, which can help prevent perineal tearing. ? In some cases, an incision may be made in your perineum (episiotomy) to allow the baby to pass through the vaginal opening. An episiotomy helps to make the opening of the vagina larger to allow more room for the baby to fit through.  It is very important to breathe and focus so your health care provider can control the delivery of your baby's head. Your health care provider may have you decrease the intensity of your pushing, to help prevent perineal tearing.  After delivery of your baby's head, the shoulders and the rest of the body generally deliver very quickly and without difficulty.  Once your baby is delivered, the umbilical cord may be cut right away, or this may be delayed for 1-2 minutes, depending on your baby's health. This may vary among health care providers, hospitals, and birth centers.  If you and your baby are healthy enough, your baby may be placed on your chest or abdomen to help maintain the baby's temperature and to help you bond with each other. Some mothers and babies start breastfeeding at this time. Your health care team will dry your baby and help keep your baby warm during this time.  Your baby may need immediate care if he or she: ? Showed signs of distress during labor. ? Has a medical condition. ? Was born too early (prematurely). ? Had a bowel movement before birth (meconium). ? Shows signs of difficulty transitioning from being inside the uterus to being outside of the uterus. If you are planning to breastfeed, your health care team will help you begin a feeding. Stage 3  The third stage of labor starts immediately after the birth of your baby and ends after you deliver the placenta. The placenta is an organ that develops  during pregnancy to provide oxygen and nutrients to your baby in the womb.  Delivering the placenta may require some pushing, and you may have mild contractions. Breastfeeding can stimulate contractions to help you deliver the placenta.  After the placenta is delivered, your uterus should tighten (contract) and become firm. This helps to stop bleeding in your uterus. To help your uterus contract and to control bleeding, your health care provider may: ? Give you medicine by injection, through an IV tube, by mouth, or through your rectum (rectally). ? Massage your abdomen or perform a vaginal exam to remove any blood clots that are left in your uterus. ? Empty your bladder by placing a thin, flexible tube (catheter) into your bladder. ? Encourage   you to breastfeed your baby. After labor is over, you and your baby will be monitored closely to ensure that you are both healthy until you are ready to go home. Your health care team will teach you how to care for yourself and your baby. This information is not intended to replace advice given to you by your health care provider. Make sure you discuss any questions you have with your health care provider. Document Released: 06/10/2008 Document Revised: 03/21/2016 Document Reviewed: 09/16/2015 Elsevier Interactive Patient Education  2018 [employer]vier Inc.  

## 2017-05-26 NOTE — Progress Notes (Signed)
ROB-Pt doing well, reports irregular contractions and generalized soreness. SVE unchanged from last visit. Encouraged warm bath with epsom salt. Meeting with pregnancy case manager after today's appointment. Reviewed red flag symptoms and when to call. RTC x 1 week for ROB or sooner if needed.

## 2017-06-02 ENCOUNTER — Encounter: Payer: Self-pay | Admitting: Certified Nurse Midwife

## 2017-06-02 ENCOUNTER — Ambulatory Visit (INDEPENDENT_AMBULATORY_CARE_PROVIDER_SITE_OTHER): Payer: Medicaid Other | Admitting: Certified Nurse Midwife

## 2017-06-02 VITALS — BP 93/69 | HR 87 | Wt 212.6 lb

## 2017-06-02 DIAGNOSIS — Z3493 Encounter for supervision of normal pregnancy, unspecified, third trimester: Secondary | ICD-10-CM

## 2017-06-02 LAB — POCT URINALYSIS DIPSTICK
Bilirubin, UA: NEGATIVE
Glucose, UA: NEGATIVE
Ketones, UA: NEGATIVE
NITRITE UA: NEGATIVE
PH UA: 6.5 (ref 5.0–8.0)
Spec Grav, UA: 1.02 (ref 1.010–1.025)
UROBILINOGEN UA: 0.2 U/dL

## 2017-06-02 NOTE — Progress Notes (Signed)
ROB- no complaints.  

## 2017-06-02 NOTE — Progress Notes (Signed)
ROB-Pt doing well, ready to have baby. Discussed home labor preparation measures including use of birthing ball, walking, evening primrose oil, clary sage oil, pineapple, dates, and spicy food. Reviewed red flag symptoms and when to call. RTC x 1 week for ROB or sooner if needed.

## 2017-06-10 ENCOUNTER — Ambulatory Visit (INDEPENDENT_AMBULATORY_CARE_PROVIDER_SITE_OTHER): Payer: Medicaid Other | Admitting: Obstetrics and Gynecology

## 2017-06-10 ENCOUNTER — Inpatient Hospital Stay: Payer: Medicaid Other | Admitting: Anesthesiology

## 2017-06-10 ENCOUNTER — Inpatient Hospital Stay
Admission: EM | Admit: 2017-06-10 | Discharge: 2017-06-12 | DRG: 775 | Disposition: A | Discharge status: Home / Self Care | Payer: Medicaid Other | Attending: Obstetrics and Gynecology | Admitting: Obstetrics and Gynecology

## 2017-06-10 VITALS — BP 128/82 | HR 88 | Wt 212.0 lb

## 2017-06-10 DIAGNOSIS — Z87891 Personal history of nicotine dependence: Secondary | ICD-10-CM

## 2017-06-10 DIAGNOSIS — Z23 Encounter for immunization: Secondary | ICD-10-CM | POA: Diagnosis not present

## 2017-06-10 DIAGNOSIS — D649 Anemia, unspecified: Secondary | ICD-10-CM | POA: Diagnosis not present

## 2017-06-10 DIAGNOSIS — O99019 Anemia complicating pregnancy, unspecified trimester: Secondary | ICD-10-CM | POA: Diagnosis not present

## 2017-06-10 DIAGNOSIS — O368130 Decreased fetal movements, third trimester, not applicable or unspecified: Principal | ICD-10-CM | POA: Diagnosis present

## 2017-06-10 DIAGNOSIS — Z3493 Encounter for supervision of normal pregnancy, unspecified, third trimester: Secondary | ICD-10-CM

## 2017-06-10 DIAGNOSIS — Z3A40 40 weeks gestation of pregnancy: Secondary | ICD-10-CM | POA: Diagnosis not present

## 2017-06-10 DIAGNOSIS — O9081 Anemia of the puerperium: Secondary | ICD-10-CM | POA: Diagnosis not present

## 2017-06-10 LAB — URINE DRUG SCREEN, QUALITATIVE (ARMC ONLY)
AMPHETAMINES, UR SCREEN: NOT DETECTED
Barbiturates, Ur Screen: NOT DETECTED
Benzodiazepine, Ur Scrn: NOT DETECTED
Cannabinoid 50 Ng, Ur ~~LOC~~: NOT DETECTED
Cocaine Metabolite,Ur ~~LOC~~: NOT DETECTED
MDMA (ECSTASY) UR SCREEN: NOT DETECTED
Methadone Scn, Ur: NOT DETECTED
Opiate, Ur Screen: NOT DETECTED
PHENCYCLIDINE (PCP) UR S: NOT DETECTED
Tricyclic, Ur Screen: NOT DETECTED

## 2017-06-10 LAB — CBC
HCT: 32.4 % — ABNORMAL LOW (ref 35.0–47.0)
HEMOGLOBIN: 11.5 g/dL — AB (ref 12.0–16.0)
MCH: 31.5 pg (ref 26.0–34.0)
MCHC: 35.5 g/dL (ref 32.0–36.0)
MCV: 88.6 fL (ref 80.0–100.0)
Platelets: 354 10*3/uL (ref 150–440)
RBC: 3.65 MIL/uL — ABNORMAL LOW (ref 3.80–5.20)
RDW: 13.3 % (ref 11.5–14.5)
WBC: 10.6 10*3/uL (ref 3.6–11.0)

## 2017-06-10 LAB — POCT URINALYSIS DIPSTICK
BILIRUBIN UA: NEGATIVE
GLUCOSE UA: NEGATIVE
KETONES UA: NEGATIVE
Nitrite, UA: NEGATIVE
PH UA: 7 (ref 5.0–8.0)
Protein, UA: NEGATIVE
SPEC GRAV UA: 1.01 (ref 1.010–1.025)
Urobilinogen, UA: 0.2 E.U./dL

## 2017-06-10 LAB — TYPE AND SCREEN
ABO/RH(D): A POS
Antibody Screen: NEGATIVE

## 2017-06-10 MED ORDER — OXYTOCIN 40 UNITS IN LACTATED RINGERS INFUSION - SIMPLE MED
1.0000 m[IU]/min | INTRAVENOUS | Status: DC
  Administered 2017-06-10: 2 m[IU]/min via INTRAVENOUS
  Filled 2017-06-10 (×2): qty 1000

## 2017-06-10 MED ORDER — OXYTOCIN BOLUS FROM INFUSION: 500.0000 mL | Freq: Once | INTRAVENOUS | Status: DC

## 2017-06-10 MED ORDER — TERBUTALINE SULFATE 1 MG/ML IJ SOLN: 0.2500 mg | Freq: Once | INTRAMUSCULAR | Status: DC | PRN

## 2017-06-10 MED ORDER — OXYTOCIN 10 UNIT/ML IJ SOLN
INTRAMUSCULAR | Status: AC
  Filled 2017-06-10: qty 2

## 2017-06-10 MED ORDER — LACTATED RINGERS IV SOLN
INTRAVENOUS | Status: DC
  Administered 2017-06-10: 13:00:00 via INTRAVENOUS

## 2017-06-10 MED ORDER — SOD CITRATE-CITRIC ACID 500-334 MG/5ML PO SOLN: 30.0000 mL | ORAL | Status: DC | PRN

## 2017-06-10 MED ORDER — AMMONIA AROMATIC IN INHA
RESPIRATORY_TRACT | Status: AC
  Filled 2017-06-10: qty 10

## 2017-06-10 MED ORDER — ACETAMINOPHEN 325 MG PO TABS: 650.0000 mg | ORAL_TABLET | ORAL | Status: DC | PRN

## 2017-06-10 MED ORDER — LACTATED RINGERS IV SOLN: 500.0000 mL | Freq: Once | INTRAVENOUS | Status: DC

## 2017-06-10 MED ORDER — PHENYLEPHRINE 40 MCG/ML (10ML) SYRINGE FOR IV PUSH (FOR BLOOD PRESSURE SUPPORT)
80.0000 ug | PREFILLED_SYRINGE | INTRAVENOUS | Status: DC | PRN
  Filled 2017-06-10: qty 5

## 2017-06-10 MED ORDER — FENTANYL CITRATE (PF) 100 MCG/2ML IJ SOLN: 50.0000 ug | INTRAMUSCULAR | Status: DC | PRN

## 2017-06-10 MED ORDER — LIDOCAINE-EPINEPHRINE (PF) 1.5 %-1:200000 IJ SOLN
INTRAMUSCULAR | Status: DC | PRN
  Administered 2017-06-10: 3 mL

## 2017-06-10 MED ORDER — DIPHENHYDRAMINE HCL 50 MG/ML IJ SOLN: 12.5000 mg | INTRAMUSCULAR | Status: DC | PRN

## 2017-06-10 MED ORDER — ONDANSETRON HCL 4 MG/2ML IJ SOLN: 4.0000 mg | Freq: Four times a day (QID) | INTRAMUSCULAR | Status: DC | PRN

## 2017-06-10 MED ORDER — OXYCODONE-ACETAMINOPHEN 5-325 MG PO TABS: 2.0000 | ORAL_TABLET | ORAL | Status: DC | PRN

## 2017-06-10 MED ORDER — FENTANYL 2.5 MCG/ML W/ROPIVACAINE 0.15% IN NS 100 ML EPIDURAL (ARMC)
EPIDURAL | Status: AC
  Filled 2017-06-10: qty 100

## 2017-06-10 MED ORDER — LIDOCAINE HCL (PF) 1 % IJ SOLN
INTRAMUSCULAR | Status: AC
  Filled 2017-06-10: qty 30

## 2017-06-10 MED ORDER — LIDOCAINE HCL (PF) 1 % IJ SOLN: 30.0000 mL | INTRAMUSCULAR | Status: DC | PRN

## 2017-06-10 MED ORDER — LACTATED RINGERS IV SOLN: 500.0000 mL | INTRAVENOUS | Status: DC | PRN

## 2017-06-10 MED ORDER — SODIUM CHLORIDE 0.9 % IV SOLN
INTRAVENOUS | Status: DC | PRN
  Administered 2017-06-10 (×3): 5 mL via EPIDURAL

## 2017-06-10 MED ORDER — FENTANYL 2.5 MCG/ML W/ROPIVACAINE 0.15% IN NS 100 ML EPIDURAL (ARMC)
12.0000 mL/h | EPIDURAL | Status: DC
  Administered 2017-06-10: 12 mL/h via EPIDURAL

## 2017-06-10 MED ORDER — EPHEDRINE 5 MG/ML INJ
10.0000 mg | INTRAVENOUS | Status: DC | PRN
  Filled 2017-06-10: qty 2

## 2017-06-10 MED ORDER — SODIUM CHLORIDE 0.9 % IV SOLN: 1.0000 g | INTRAVENOUS | Status: DC

## 2017-06-10 MED ORDER — OXYTOCIN 40 UNITS IN LACTATED RINGERS INFUSION - SIMPLE MED: 2.5000 [IU]/h | INTRAVENOUS | Status: DC

## 2017-06-10 MED ORDER — MISOPROSTOL 200 MCG PO TABS
ORAL_TABLET | ORAL | Status: AC
  Filled 2017-06-10: qty 4

## 2017-06-10 NOTE — Anesthesia Procedure Notes (Signed)
Epidural Patient location during procedure: OB Start time: 06/10/2017 6:58 PM End time: 06/10/2017 7:15 PM  Staffing Anesthesiologist: Alver Fisher Performed: anesthesiologist   Preanesthetic Checklist Completed: patient identified, site marked, surgical consent, pre-op evaluation, timeout performed, IV checked, risks and benefits discussed and monitors and equipment checked  Epidural Patient position: sitting Prep: ChloraPrep Patient monitoring: heart rate, continuous pulse ox and blood pressure Approach: midline Location: L3-L4 Injection technique: LOR saline  Needle:  Needle type: Tuohy  Needle gauge: 18 G Needle length: 9 cm and 9 Needle insertion depth: 6.5 cm Catheter type: closed end flexible Catheter size: 20 Guage Catheter at skin depth: 11 cm Test dose: negative (0.125% bupivacaine)  Assessment Events: blood not aspirated, injection not painful, no injection resistance, negative IV test and no paresthesia  Additional Notes   Patient tolerated the insertion well without complications.Reason for block:procedure for pain

## 2017-06-10 NOTE — Anesthesia Preprocedure Evaluation (Signed)
Anesthesia Evaluation  Patient identified by MRN, date of birth, ID band Patient awake    Reviewed: Allergy & Precautions, NPO status , Patient's Chart, lab work & pertinent test results  History of Anesthesia Complications Negative for: history of anesthetic complications  Airway Mallampati: III  TM Distance: >3 FB Neck ROM: Full    Dental   Pulmonary neg sleep apnea, neg COPD, former smoker,    breath sounds clear to auscultation- rhonchi (-) wheezing      Cardiovascular Exercise Tolerance: Good (-) hypertension(-) CAD and (-) Past MI  Rhythm:Regular Rate:Normal - Systolic murmurs and - Diastolic murmurs    Neuro/Psych negative neurological ROS  negative psych ROS   GI/Hepatic negative GI ROS, Neg liver ROS,   Endo/Other  negative endocrine ROSneg diabetes  Renal/GU negative Renal ROS     Musculoskeletal negative musculoskeletal ROS (+)   Abdominal (+) + obese, Gravid abdomen  Peds  Hematology negative hematology ROS (+)   Anesthesia Other Findings   Reproductive/Obstetrics (+) Pregnancy                             Anesthesia Physical Anesthesia Plan  ASA: II  Anesthesia Plan: Epidural   Post-op Pain Management:    Induction:   PONV Risk Score and Plan: 2  Airway Management Planned:   Additional Equipment:   Intra-op Plan:   Post-operative Plan:   Informed Consent: I have reviewed the patients History and Physical, chart, labs and discussed the procedure including the risks, benefits and alternatives for the proposed anesthesia with the patient or authorized representative who has indicated his/her understanding and acceptance.     Plan Discussed with: Anesthesiologist  Anesthesia Plan Comments: (Plan for epidural for labor, discussed epidural vs spinal vs GA if need for csection)        Lab Results  Component Value Date   WBC 10.6 06/10/2017   HGB 11.5 (L)  06/10/2017   HCT 32.4 (L) 06/10/2017   MCV 88.6 06/10/2017   PLT 354 06/10/2017    Anesthesia Quick Evaluation

## 2017-06-10 NOTE — Progress Notes (Signed)
Pt requests epidural 1857 Dr Priscella Mann notified 1858 MD at bedside 1908 test dose given 1911 bolus dose given 1915 bolus given 1915 drip started

## 2017-06-10 NOTE — Progress Notes (Signed)
ROB- pt is having some pelvic pressure, lots of back pain

## 2017-06-10 NOTE — H&P (Signed)
Obstetric History and Physical  Teresa Kemp is a 26 y.o. G2P0101 with IUP at [redacted]w[redacted]d presenting with irregular contractions and advanced dilation. Patient states she has been having  irregular, every 6-9 minutes contractions, none vaginal bleeding, intact membranes, with active fetal movement.    Prenatal Course Source of Care: [facility]  Pregnancy complications or risks:none  Prenatal labs and studies: ABO, Rh: A/Positive/-- (04/17 0000) Antibody: Negative (04/17 0000) Rubella: Nonimmune (04/17 0000) RPR:   negative HBsAg:   negative HIV: Non-reactive (04/17 0000)  GBS: negative 1 hr Glucola  Elevated with normal 3 hr Genetic screening normal Anatomy US normal  No past medical history on file.  No past surgical history on file.  OB History  Gravida Para Term Preterm AB Living  2 1 0 1   1  SAB TAB Ectopic Multiple Live Births          1    # Outcome Date GA Lbr Len/2nd Weight Sex Delivery Anes PTL Lv  2 Current           1 Preterm 2009   3 lb 7 oz (1.559 kg) M Vag-Spont  Y LIV      Social History   Social History  . Marital status: Single    Spouse name: N/A  . Number of children: N/A  . Years of education: N/A   Social History Main Topics  . Smoking status: Former Games developer  . Smokeless tobacco: Never Used  . Alcohol use No  . Drug use: No  . Sexual activity: Yes    Birth control/ protection: None   Other Topics Concern  . Not on file   Social History Narrative  . No narrative on file    Family History  Problem Relation Age of Onset  . Diabetes Father   . Diabetes Brother   . Diabetes Paternal Uncle   . Diabetes Paternal Grandmother   . Stroke Paternal Grandfather   . Diabetes Paternal Grandfather      (Not in a hospital admission)  No Known Allergies  Review of Systems: Negative except for what is mentioned in HPI.  Physical Exam: BP 128/82   Pulse 88   Wt 212 lb (96.2 kg)   LMP 09/03/2016 (Exact Date)   BMI 34.22 kg/m  GENERAL:  Well-developed, well-nourished female in no acute distress.  LUNGS: Clear to auscultation bilaterally.  HEART: Regular rate and rhythm. ABDOMEN: Soft, nontender, nondistended, gravid. EXTREMITIES: Nontender, no edema, 2+ distal pulses. Cervical Exam: Dilation: 5.5 Effacement (%): 80 Station: -2 Presentation: Vertex FHT:  Baseline rate 135 bpm   Variability moderate  Accelerations present   Decelerations none Contractions: will monitor   Pertinent Labs/Studies:   Results for orders placed or performed in visit on 06/10/17 (from the past 24 hour(s))  POCT urinalysis dipstick     Status: Abnormal   Collection Time: 06/10/17  9:57 AM  Result Value Ref Range   Color, UA pale yellow    Clarity, UA clear    Glucose, UA neg    Bilirubin, UA neg    Ketones, UA neg    Spec Grav, UA 1.010 1.010 - 1.025   Blood, UA trace    pH, UA 7.0 5.0 - 8.0   Protein, UA neg    Urobilinogen, UA 0.2 0.2 or 1.0 E.U./dL   Nitrite, UA neg    Leukocytes, UA Large (3+) (A) Negative    Assessment : Teresa Kemp is a 26 y.o. G2P0101 at [redacted]w[redacted]d being  admitted for labor.  Plan: Labor: Expectant management.  Induction/Augmentation as needed, per protocol FWB: Reassuring fetal heart tracing.  GBS negative Delivery plan: Hopeful for vaginal delivery  Dnya Hickle, CNM [facility]

## 2017-06-10 NOTE — Progress Notes (Signed)
ROB-sending to L&D for delivery

## 2017-06-10 NOTE — Progress Notes (Signed)
Teresa Kemp is a 26 y.o. G2P0101 at [redacted]w[redacted]d by LMP admitted for active labor  Subjective: Rates pain a 7 on pain scale for last hour  Objective: BP 110/72 (BP Location: Right Arm)   Pulse 79   Temp (!) 97.5 F (36.4 C) (Oral)   Resp 16   Ht  (1.676 m)   Wt 214 lb (97.1 kg)   LMP 09/03/2016 (Exact Date)   BMI 34.54 kg/m  No intake/output data recorded. No intake/output data recorded.  FHT:  FHR: 144 bpm, variability: moderate,  accelerations:  Present,  decelerations:  Absent UC:   irregular, every 2-3 minutes on 38mu/min piitocin SVE:   Dilation: 6 Effacement (%): 90 Station: -1 Exam by:: Shambley, CNM, AROM with small amount clear fluid  Labs: Lab Results  Component Value Date   WBC 10.6 06/10/2017   HGB 11.5 (L) 06/10/2017   HCT 32.4 (L) 06/10/2017   MCV 88.6 06/10/2017   PLT 354 06/10/2017    Assessment / Plan: Augmentation of labor, progressing well  Labor: Progressing on Pitocin, will continue to increase then AROM Preeclampsia:  labs stable Fetal Wellbeing:  Category I Pain Control:  Labor support without medications I/D:  n/a Anticipated MOD:  NSVD  Melody N Shambley 06/10/2017, 5:57 PM

## 2017-06-11 LAB — CBC
HCT: 30.3 % — ABNORMAL LOW (ref 35.0–47.0)
Hemoglobin: 10.6 g/dL — ABNORMAL LOW (ref 12.0–16.0)
MCH: 31.4 pg (ref 26.0–34.0)
MCHC: 35 g/dL (ref 32.0–36.0)
MCV: 89.8 fL (ref 80.0–100.0)
PLATELETS: 302 10*3/uL (ref 150–440)
RBC: 3.38 MIL/uL — ABNORMAL LOW (ref 3.80–5.20)
RDW: 13.2 % (ref 11.5–14.5)
WBC: 14.4 10*3/uL — AB (ref 3.6–11.0)

## 2017-06-11 LAB — RPR: RPR: NONREACTIVE

## 2017-06-11 MED ORDER — COCONUT OIL OIL: 1.0000 "application " | TOPICAL_OIL | Status: DC | PRN

## 2017-06-11 MED ORDER — WITCH HAZEL-GLYCERIN EX PADS: 1.0000 "application " | MEDICATED_PAD | CUTANEOUS | Status: DC | PRN

## 2017-06-11 MED ORDER — SENNOSIDES-DOCUSATE SODIUM 8.6-50 MG PO TABS
2.0000 | ORAL_TABLET | ORAL | Status: DC
  Administered 2017-06-11: 2 via ORAL
  Filled 2017-06-11: qty 2

## 2017-06-11 MED ORDER — ACETAMINOPHEN 325 MG PO TABS
650.0000 mg | ORAL_TABLET | ORAL | Status: DC | PRN
  Administered 2017-06-11 – 2017-06-12 (×2): 650 mg via ORAL
  Filled 2017-06-11 (×2): qty 2

## 2017-06-11 MED ORDER — DIPHENHYDRAMINE HCL 25 MG PO CAPS: 25.0000 mg | ORAL_CAPSULE | Freq: Four times a day (QID) | ORAL | Status: DC | PRN

## 2017-06-11 MED ORDER — BENZOCAINE-MENTHOL 20-0.5 % EX AERO
1.0000 "application " | INHALATION_SPRAY | CUTANEOUS | Status: DC | PRN
  Filled 2017-06-11: qty 56

## 2017-06-11 MED ORDER — SIMETHICONE 80 MG PO CHEW: 80.0000 mg | CHEWABLE_TABLET | ORAL | Status: DC | PRN

## 2017-06-11 MED ORDER — ONDANSETRON HCL 4 MG PO TABS: 4.0000 mg | ORAL_TABLET | ORAL | Status: DC | PRN

## 2017-06-11 MED ORDER — DIBUCAINE 1 % RE OINT: 1.0000 "application " | TOPICAL_OINTMENT | RECTAL | Status: DC | PRN

## 2017-06-11 MED ORDER — IBUPROFEN 600 MG PO TABS
600.0000 mg | ORAL_TABLET | Freq: Four times a day (QID) | ORAL | Status: DC
  Administered 2017-06-11 – 2017-06-12 (×6): 600 mg via ORAL
  Filled 2017-06-11 (×5): qty 1

## 2017-06-11 MED ORDER — PRENATAL MULTIVITAMIN CH
1.0000 | ORAL_TABLET | Freq: Every day | ORAL | Status: DC
  Administered 2017-06-11 – 2017-06-12 (×2): 1 via ORAL
  Filled 2017-06-11 (×2): qty 1

## 2017-06-11 MED ORDER — OXYTOCIN 40 UNITS IN LACTATED RINGERS INFUSION - SIMPLE MED
INTRAVENOUS | Status: AC
  Administered 2017-06-11: 05:00:00
  Filled 2017-06-11: qty 1000

## 2017-06-11 MED ORDER — ONDANSETRON HCL 4 MG/2ML IJ SOLN: 4.0000 mg | INTRAMUSCULAR | Status: DC | PRN

## 2017-06-11 MED ORDER — IBUPROFEN 600 MG PO TABS
ORAL_TABLET | ORAL | Status: AC
  Filled 2017-06-11: qty 1

## 2017-06-11 NOTE — Progress Notes (Signed)
Post Partum Day 1 Subjective: no complaints  Objective: Blood pressure 122/63, pulse 68, temperature 97.8 F (36.6 C), temperature source Oral, resp. rate 18, height  (1.676 m), weight 214 lb (97.1 kg), last menstrual period 09/03/2016, SpO2 100 %, unknown if currently breastfeeding.  Physical Exam:  General: alert, cooperative and appears stated age Lochia: appropriate Uterine Fundus: firm Incision: NA DVT Evaluation: No evidence of DVT seen on physical exam. Negative Homan's sign.   Recent Labs  06/10/17 1258 06/11/17 0443  HGB 11.5* 10.6*  HCT 32.4* 30.3*    Assessment/Plan: Plan for discharge tomorrow     LOS: 1 day   Melody N Shambley 06/11/2017, 8:07 AM

## 2017-06-11 NOTE — Anesthesia Postprocedure Evaluation (Signed)
Anesthesia Post Note  Patient: Teresa Kemp  Procedure(s) Performed: * No procedures listed *  Patient location during evaluation: Mother Baby Anesthesia Type: Epidural Level of consciousness: awake, awake and alert and oriented Pain management: pain level controlled Vital Signs Assessment: post-procedure vital signs reviewed and stable Respiratory status: spontaneous breathing, nonlabored ventilation and respiratory function stable Cardiovascular status: blood pressure returned to baseline and stable Postop Assessment: no headache and no backache Anesthetic complications: no     Last Vitals:  Vitals:   06/11/17 0151 06/11/17 0300  BP: (!) 90/59 123/70  Pulse: 90 77  Resp: 18 18  Temp:  36.9 C  SpO2:  100%    Last Pain:  Vitals:   06/11/17 0525  TempSrc:   PainSc: 6                  [employer]

## 2017-06-12 ENCOUNTER — Encounter: Payer: Self-pay | Admitting: *Deleted

## 2017-06-12 MED ORDER — CITRANATAL BLOOM 90-1 MG PO TABS: 1.0000 | ORAL_TABLET | Freq: Every day | ORAL | 11 refills | Status: AC

## 2017-06-12 MED ORDER — MEASLES, MUMPS & RUBELLA VAC ~~LOC~~ INJ
0.5000 mL | INJECTION | Freq: Once | SUBCUTANEOUS | Status: AC
  Administered 2017-06-12: 0.5 mL via SUBCUTANEOUS
  Filled 2017-06-12 (×2): qty 0.5

## 2017-06-12 MED ORDER — INFLUENZA VAC SPLIT QUAD 0.5 ML IM SUSY
0.5000 mL | PREFILLED_SYRINGE | INTRAMUSCULAR | Status: AC
  Administered 2017-06-12: 0.5 mL via INTRAMUSCULAR

## 2017-06-12 NOTE — H&P (Signed)
L&D OB Triage Note  Teresa Kemp is a 26 y.o. [id number] female at [redacted]w[redacted]d, EDD Estimated Date of Delivery: 06/10/17 who presented to triage for complaints of decreased fetal mov't x 2 days.  She was evaluated by the nurses with no significant findings/findings significant for fetal distress. Vital signs stable. An NST was performed and has been reviewed by Me. She was reassured as fetus started moving a lot when arrived.   NST INTERPRETATION: Indications: decreased fetal movement  Mode: External Baseline Rate (A): 120 bpm Variability: Moderate Accelerations: 15 x 15 Decelerations: Variable     Contraction Frequency (min): 1-3  Impression: reactive   Plan: NST performed was reviewed and was found to be reactive. She was discharged home with bleeding/labor precautions.  Continue routine prenatal care. Follow up with OB/GYN as previously scheduled.     Melody Suzan Nailer, CNM

## 2017-06-12 NOTE — Discharge Summary (Signed)
Physician Obstetric Discharge Summary  Patient ID: Teresa Kemp MRN: [id number] DOB/AGE: November 06, 1990 26 y.o.   Date of Admission: 06/10/2017  Date of Discharge:   Admitting Diagnosis: Onset of Labor at [redacted]w[redacted]d  Secondary Diagnosis: Anemia in pregnancy  Mode of Delivery: normal spontaneous vaginal deliveryn     Discharge Diagnosis: SVD, with mild PP anemia   Intrapartum Procedures: Atificial rupture of membranes and pitocin augmentation   Post partum procedures: none  Complications: none   Brief Hospital Course  Teresa Kemp is a [id number] who had a SVD on 06/10/17;  for further details of this surgery, please refer to the delivey note.  Patient had an uncomplicated postpartum course.  By time of discharge on PPD#2, her pain was controlled on oral pain medications; she had appropriate lochia and was ambulating, voiding without difficulty and tolerating regular diet.  She was deemed stable for discharge to home.    Labs: CBC Latest Ref Rng & Units 06/11/2017 06/10/2017 12/30/2016  WBC 3.6 - 11.0 K/uL 14.4(H) 10.6 -  Hemoglobin 12.0 - 16.0 g/dL 10.6(L) 11.5(L) 11.5  Hematocrit 35.0 - 47.0 % 30.3(L) 32.4(L) 34  Platelets 150 - 440 K/uL 302 354 380   A POS  Physical exam:  Blood pressure 140/88, pulse 94, temperature 98.3 F (36.8 C), temperature source Axillary, resp. rate 18, height  (1.676 m), weight 214 lb (97.1 kg), last menstrual period 09/03/2016, SpO2 100 %, unknown if currently breastfeeding. General: alert and no distress Lochia: appropriate Abdomen: soft, NT Uterine Fundus: firm Extremities: No evidence of DVT seen on physical exam. No lower extremity edema.  Discharge Instructions: Per After Visit Summary. Activity: Advance as tolerated. Pelvic rest for 6 weeks.  Also refer to After Visit Summary Diet: Regular Medications: Allergies as of 06/12/2017   No Known Allergies     Medication List    STOP taking these medications   prenatal multivitamin  Tabs tablet     TAKE these medications   CITRANATAL BLOOM 90-1 MG Tabs Take 1 tablet by mouth daily.            Discharge Care Instructions        Start     Ordered   06/12/17 0000  Prenatal-DSS-FeCb-FeGl-FA (CITRANATAL BLOOM) 90-1 MG TABS  Daily     06/12/17 0753   06/12/17 0000  Discharge instructions    Comments:  No driving for 2 weeks; No heavy lifting or strenuous activity for 2 weeks; Showers only (no tub baths, submersion in water/swimming pool or hot tubs) until cleared at 6 week postpartum appointment.   06/12/17 0753   06/12/17 0000  Call MD for:  temperature >100.4     06/12/17 0753   06/12/17 0000  Call MD for:  persistant nausea and vomiting     06/12/17 0753   06/12/17 0000  Call MD for:  severe uncontrolled pain     06/12/17 0753   06/12/17 0000  Call MD for:  redness, tenderness, or signs of infection (pain, swelling, redness, odor or green/yellow discharge around incision site)     06/12/17 0753   06/12/17 0000  Activity as tolerated     06/12/17 0753   06/12/17 0000  Sexual acrtivity    Comments:  No sex until cleared at 6 week postpartum visit   06/12/17 0753   06/12/17 0000  Diet general     06/12/17 0753     Outpatient follow up:  Postpartum contraception: unsure, will decide by six week appointment  Discharged Condition: good  Discharged to: home   Newborn Data: Disposition:home with mother  Apgars: APGAR (1 MIN): 8   APGAR (5 MINS): 9   APGAR (10 MINS):    Baby Feeding: Bottle  [name], CNM

## 2017-06-12 NOTE — Progress Notes (Signed)
Discharge instructions reviewed with patient.  All questions answered.

## 2018-03-04 ENCOUNTER — Ambulatory Visit (INDEPENDENT_AMBULATORY_CARE_PROVIDER_SITE_OTHER): Payer: Medicaid Other | Admitting: Certified Nurse Midwife

## 2018-03-04 ENCOUNTER — Encounter: Payer: Self-pay | Admitting: Certified Nurse Midwife

## 2018-03-04 ENCOUNTER — Other Ambulatory Visit: Payer: Self-pay | Admitting: Certified Nurse Midwife

## 2018-03-04 ENCOUNTER — Ambulatory Visit (INDEPENDENT_AMBULATORY_CARE_PROVIDER_SITE_OTHER): Payer: Medicaid Other

## 2018-03-04 VITALS — BP 116/79 | HR 78 | Wt 181.4 lb

## 2018-03-04 DIAGNOSIS — Z3A09 9 weeks gestation of pregnancy: Secondary | ICD-10-CM

## 2018-03-04 DIAGNOSIS — O3680X0 Pregnancy with inconclusive fetal viability, not applicable or unspecified: Secondary | ICD-10-CM

## 2018-03-04 DIAGNOSIS — N926 Irregular menstruation, unspecified: Secondary | ICD-10-CM

## 2018-03-04 DIAGNOSIS — Z3482 Encounter for supervision of other normal pregnancy, second trimester: Secondary | ICD-10-CM

## 2018-03-04 DIAGNOSIS — O208 Other hemorrhage in early pregnancy: Secondary | ICD-10-CM | POA: Diagnosis not present

## 2018-03-04 NOTE — Patient Instructions (Signed)
Common Medications Safe in Pregnancy  Acne:      Constipation:  Benzoyl Peroxide     Colace  Clindamycin      Dulcolax Suppository  Topica Erythromycin     Fibercon  Salicylic Acid      Metamucil         Miralax AVOID:        Senakot   Accutane    Cough:  Retin-A       Cough Drops  Tetracycline      Phenergan w/ Codeine if Rx  Minocycline      Robitussin (Plain & DM)  Antibiotics:     Crabs/Lice:  Ceclor       RID  Cephalosporins    AVOID:  E-Mycins      Kwell  Keflex  Macrobid/Macrodantin   Diarrhea:  Penicillin      Kao-Pectate  Zithromax      Imodium AD         PUSH FLUIDS AVOID:       Cipro     Fever:  Tetracycline      Tylenol (Regular or Extra  Minocycline       Strength)  Levaquin      Extra Strength-Do not          Exceed 8 tabs/24 hrs Caffeine:        <200mg/day (equiv. To 1 cup of coffee or  approx. 3 12 oz sodas)         Gas: Cold/Hayfever:       Gas-X  Benadryl      Mylicon  Claritin       Phazyme  **Claritin-D        Chlor-Trimeton    Headaches:  Dimetapp      ASA-Free Excedrin  Drixoral-Non-Drowsy     Cold Compress  Mucinex (Guaifenasin)     Tylenol (Regular or Extra  Sudafed/Sudafed-12 Hour     Strength)  **Sudafed PE Pseudoephedrine   Tylenol Cold & Sinus     Vicks Vapor Rub  Zyrtec  **AVOID if Problems With Blood Pressure         Heartburn: Avoid lying down for at least 1 hour after meals  Aciphex      Maalox     Rash:  Milk of Magnesia     Benadryl    Mylanta       1% Hydrocortisone Cream  Pepcid  Pepcid Complete   Sleep Aids:  Prevacid      Ambien   Prilosec       Benadryl  Rolaids       Chamomile Tea  Tums (Limit 4/day)     Unisom  Zantac       Tylenol PM         Warm milk-add vanilla or  Hemorrhoids:       Sugar for taste  Anusol/Anusol H.C.  (RX: Analapram 2.5%)  Sugar Substitutes:  Hydrocortisone OTC     Ok in moderation  Preparation H      Tucks        Vaseline lotion applied to tissue with  wiping    Herpes:     Throat:  Acyclovir      Oragel  Famvir  Valtrex     Vaccines:         Flu Shot Leg Cramps:       *Gardasil  Benadryl      Hepatitis A         Hepatitis B Nasal Spray:         Pneumovax  Saline Nasal Spray     Polio Booster         Tetanus Nausea:       Tuberculosis test or PPD  Vitamin B6 25 mg TID   AVOID:    Dramamine      *Gardasil  Emetrol       Live Poliovirus  Ginger Root 250 mg QID    MMR (measles, mumps &  High Complex Carbs @ Bedtime    rebella)  Sea Bands-Accupressure    Varicella (Chickenpox)  Unisom 1/2 tab TID     *No known complications           If received before Pain:         Known pregnancy;   Darvocet       Resume series after  Lortab        Delivery  Percocet    Yeast:   Tramadol      Femstat  Tylenol 3      Gyne-lotrimin  Ultram       Monistat  Vicodin           MISC:         All Sunscreens           Hair Coloring/highlights          Insect Repellant's          (Including DEET)         Mystic Tans Morning Sickness Morning sickness is when you feel sick to your stomach (nauseous) during pregnancy. You may feel sick to your stomach and throw up (vomit). You may feel sick in the morning, but you can feel this way any time of day. Some women feel very sick to their stomach and cannot stop throwing up (hyperemesis gravidarum). Follow these instructions at home:  Only take medicines as told by your doctor.  Take multivitamins as told by your doctor. Taking multivitamins before getting pregnant can stop or lessen the harshness of morning sickness.  Eat dry toast or unsalted crackers before getting out of bed.  Eat 5 to 6 small meals a day.  Eat dry and bland foods like rice and baked potatoes.  Do not drink liquids with meals. Drink between meals.  Do not eat greasy, fatty, or spicy foods.  Have someone cook for you if the smell of food causes you to feel sick or throw up.  If you feel sick to your stomach after taking prenatal  vitamins, take them at night or with a snack.  Eat protein when you need a snack (nuts, yogurt, cheese).  Eat unsweetened gelatins for dessert.  Wear a bracelet used for sea sickness (acupressure wristband).  Go to a doctor that puts thin needles into certain body points (acupuncture) to improve how you feel.  Do not smoke.  Use a humidifier to keep the air in your house free of odors.  Get lots of fresh air. Contact a doctor if:  You need medicine to feel better.  You feel dizzy or lightheaded.  You are losing weight. Get help right away if:  You feel very sick to your stomach and cannot stop throwing up.  You pass out (faint). This information is not intended to replace advice given to you by your health care provider. Make sure you discuss any questions you have with your health care provider. Document Released: 10/09/2004 Document Revised: 02/07/2016 Document Reviewed: 02/16/2013 Elsevier Interactive Patient Education  2017 [place] of Pregnancy The  first trimester of pregnancy is from week 1 until the end of week 13 (months 1 through 3). During this time, your baby will begin to develop inside you. At 6-8 weeks, the eyes and face are formed, and the heartbeat can be seen on ultrasound. At the end of 12 weeks, all the baby's organs are formed. Prenatal care is all the medical care you receive before the birth of your baby. Make sure you get good prenatal care and follow all of your doctor's instructions. Follow these instructions at home: Medicines  Take over-the-counter and prescription medicines only as told by your doctor. Some medicines are safe and some medicines are not safe during pregnancy.  Take a prenatal vitamin that contains at least 600 micrograms (mcg) of folic acid.  If you have trouble pooping (constipation), take medicine that will make your stool soft (stool softener) if your doctor approves. Eating and drinking  Eat regular,  healthy meals.  Your doctor will tell you the amount of weight gain that is right for you.  Avoid raw meat and uncooked cheese.  If you feel sick to your stomach (nauseous) or throw up (vomit): ? Eat 4 or 5 small meals a day instead of 3 large meals. ? Try eating a few soda crackers. ? Drink liquids between meals instead of during meals.  To prevent constipation: ? Eat foods that are high in fiber, like fresh fruits and vegetables, whole grains, and beans. ? Drink enough fluids to keep your pee (urine) clear or pale yellow. Activity  Exercise only as told by your doctor. Stop exercising if you have cramps or pain in your lower belly (abdomen) or low back.  Do not exercise if it is too hot, too humid, or if you are in a place of great height (high altitude).  Try to avoid standing for long periods of time. Move your legs often if you must stand in one place for a long time.  Avoid heavy lifting.  Wear low-heeled shoes. Sit and stand up straight.  You can have sex unless your doctor tells you not to. Relieving pain and discomfort  Wear a good support bra if your breasts are sore.  Take warm water baths (sitz baths) to soothe pain or discomfort caused by hemorrhoids. Use hemorrhoid cream if your doctor says it is okay.  Rest with your legs raised if you have leg cramps or low back pain.  If you have puffy, bulging veins (varicose veins) in your legs: ? Wear support hose or compression stockings as told by your doctor. ? Raise (elevate) your feet for 15 minutes, 3-4 times a day. ? Limit salt in your food. Prenatal care  Schedule your prenatal visits by the twelfth week of pregnancy.  Write down your questions. Take them to your prenatal visits.  Keep all your prenatal visits as told by your doctor. This is important. Safety  Wear your seat belt at all times when driving.  Make a list of emergency phone numbers. The list should include numbers for family, friends, the  hospital, and police and fire departments. General instructions  Ask your doctor for a referral to a local prenatal class. Begin classes no later than at the start of month 6 of your pregnancy.  Ask for help if you need counseling or if you need help with nutrition. Your doctor can give you advice or tell you where to go for help.  Do not use hot tubs, steam rooms, or saunas.  Do not  douche or use tampons or scented sanitary pads.  Do not cross your legs for long periods of time.  Avoid all herbs and alcohol. Avoid drugs that are not approved by your doctor.  Do not use any tobacco products, including cigarettes, chewing tobacco, and electronic cigarettes. If you need help quitting, ask your doctor. You may get counseling or other support to help you quit.  Avoid cat litter boxes and soil used by cats. These carry germs that can cause birth defects in the baby and can cause a loss of your baby (miscarriage) or stillbirth.  Visit your dentist. At home, brush your teeth with a soft toothbrush. Be gentle when you floss. Contact a doctor if:  You are dizzy.  You have mild cramps or pressure in your lower belly.  You have a nagging pain in your belly area.  You continue to feel sick to your stomach, you throw up, or you have watery poop (diarrhea).  You have a bad smelling fluid coming from your vagina.  You have pain when you pee (urinate).  You have increased puffiness (swelling) in your face, hands, legs, or ankles. Get help right away if:  You have a fever.  You are leaking fluid from your vagina.  You have spotting or bleeding from your vagina.  You have very bad belly cramping or pain.  You gain or lose weight rapidly.  You throw up blood. It may look like coffee grounds.  You are around people who have [place] measles, fifth disease, or chickenpox.  You have a very bad headache.  You have shortness of breath.  You have any kind of trauma, such as from a fall or  a car accident. Summary  The first trimester of pregnancy is from week 1 until the end of week 13 (months 1 through 3).  To take care of yourself and your unborn baby, you will need to eat healthy meals, take medicines only if your doctor tells you to do so, and do activities that are safe for you and your baby.  Keep all follow-up visits as told by your doctor. This is important as your doctor will have to ensure that your baby is healthy and growing well. This information is not intended to replace advice given to you by your health care provider. Make sure you discuss any questions you have with your health care provider. Document Released: 02/18/2008 Document Revised: 09/09/2016 Document Reviewed: 09/09/2016 Elsevier Interactive Patient Education  2017 [employer].

## 2018-03-04 NOTE — Progress Notes (Signed)
Pt is here for a NOB PE. C/o nausea.

## 2018-03-05 LAB — CBC WITH DIFFERENTIAL
BASOS ABS: 0 10*3/uL (ref 0.0–0.2)
Basos: 0 %
EOS (ABSOLUTE): 0.1 10*3/uL (ref 0.0–0.4)
Eos: 2 %
HEMOGLOBIN: 12.7 g/dL (ref 11.1–15.9)
Hematocrit: 38.4 % (ref 34.0–46.6)
IMMATURE GRANS (ABS): 0 10*3/uL (ref 0.0–0.1)
IMMATURE GRANULOCYTES: 0 %
LYMPHS: 26 %
Lymphocytes Absolute: 2.4 10*3/uL (ref 0.7–3.1)
MCH: 29.1 pg (ref 26.6–33.0)
MCHC: 33.1 g/dL (ref 31.5–35.7)
MCV: 88 fL (ref 79–97)
MONOCYTES: 7 %
Monocytes Absolute: 0.7 10*3/uL (ref 0.1–0.9)
NEUTROS PCT: 65 %
Neutrophils Absolute: 6.1 10*3/uL (ref 1.4–7.0)
RBC: 4.36 x10E6/uL (ref 3.77–5.28)
RDW: 14.1 % (ref 12.3–15.4)
WBC: 9.3 10*3/uL (ref 3.4–10.8)

## 2018-03-05 LAB — ABO AND RH: RH TYPE: POSITIVE

## 2018-03-05 LAB — URINALYSIS, ROUTINE W REFLEX MICROSCOPIC
Bilirubin, UA: NEGATIVE
Glucose, UA: NEGATIVE
Ketones, UA: NEGATIVE
Leukocytes, UA: NEGATIVE
NITRITE UA: NEGATIVE
PH UA: 6 (ref 5.0–7.5)
PROTEIN UA: NEGATIVE
RBC, UA: NEGATIVE
Specific Gravity, UA: 1.029 (ref 1.005–1.030)
UUROB: 0.2 mg/dL (ref 0.2–1.0)

## 2018-03-05 LAB — VARICELLA ZOSTER ANTIBODY, IGG: VARICELLA: 731 {index} (ref >165)

## 2018-03-05 LAB — HEPATITIS B SURFACE ANTIGEN: HEP B S AG: NEGATIVE

## 2018-03-05 LAB — HIV ANTIBODY (ROUTINE TESTING W REFLEX): HIV SCREEN 4TH GENERATION: NONREACTIVE

## 2018-03-05 LAB — RPR: RPR Ser Ql: NONREACTIVE

## 2018-03-05 LAB — SICKLE CELL SCREEN: Sickle Cell Screen: NEGATIVE

## 2018-03-05 LAB — ANTIBODY SCREEN: ANTIBODY SCREEN: NEGATIVE

## 2018-03-05 LAB — RUBELLA SCREEN: RUBELLA: 1.34 {index} (ref >0.99)

## 2018-03-05 NOTE — Progress Notes (Signed)
NEW OB HISTORY AND PHYSICAL  SUBJECTIVE:       Teresa Kemp is a 27 y.o. [phone] female, Patient's last menstrual period was 11/27/2017 (approximate)., Estimated Date of Delivery: 10/07/18, [redacted]w[redacted]d, presents today for establishment of Prenatal Care.  Endorses nausea without vomiting and intermittent lower abdominal cramping.   Denies difficulty breathing or respiratory distress, chest pain, abdominal pain, vaginal bleeding, dysuria, and leg pain or swelling.   This pregnancy was not planned yet not prevented.    Gynecologic History  Patient's last menstrual period was 11/27/2017 (approximate). Irregular.   Contraception: none  Last Pap: due.  Obstetric History  OB History  Gravida Para Term Preterm AB Living  3 2 1 1   2   SAB TAB Ectopic Multiple Live Births        0 2    # Outcome Date GA Lbr Len/2nd Weight Sex Delivery Anes PTL Lv  3 Current           2 Term 06/10/17 [redacted]w[redacted]d  7 lb 10.4 oz (3.47 kg) M Vag-Spont EPI  LIV  1 Preterm 2009   3 lb 7 oz (1.559 kg) M Vag-Spont  Y LIV    No past medical history on file.  No past surgical history on file.  Current Outpatient Medications on File Prior to Visit  Medication Sig Dispense Refill  . Prenatal-DSS-FeCb-FeGl-FA (CITRANATAL BLOOM) 90-1 MG TABS Take 1 tablet by mouth daily. 30 tablet 11   No current facility-administered medications on file prior to visit.     No Known Allergies  Social History   Socioeconomic History  . Marital status: Single    Spouse name: Not on file  . Number of children: Not on file  . Years of education: Not on file  . Highest education level: Not on file  Occupational History  . Not on file  Social Needs  . Financial resource strain: Not on file  . Food insecurity:    Worry: Not on file    Inability: Not on file  . Transportation needs:    Medical: Not on file    Non-medical: Not on file  Tobacco Use  . Smoking status: Former Smoker    Last attempt to quit: 11/13/2016    Years  since quitting: 1.3  . Smokeless tobacco: Never Used  Substance and Sexual Activity  . Alcohol use: No  . Drug use: No  . Sexual activity: Yes    Birth control/protection: None  Lifestyle  . Physical activity:    Days per week: Not on file    Minutes per session: Not on file  . Stress: Not on file  Relationships  . Social connections:    Talks on phone: Not on file    Gets together: Not on file    Attends religious service: Not on file    Active member of club or organization: Not on file    Attends meetings of clubs or organizations: Not on file    Relationship status: Not on file  . Intimate partner violence:    Fear of current or ex partner: Not on file    Emotionally abused: Not on file    Physically abused: Not on file    Forced sexual activity: Not on file  Other Topics Concern  . Not on file  Social History Narrative  . Not on file    Family History  Problem Relation Age of Onset  . Diabetes Father   . Diabetes Brother   .  Diabetes Paternal Uncle   . Diabetes Paternal Grandmother   . Stroke Paternal Grandfather   . Diabetes Paternal Grandfather     The following portions of the patient's history were reviewed and updated as appropriate: allergies, current medications, past OB history, past medical history, past surgical history, past family history, past social history, and problem list.   OBJECTIVE:  BP 116/79   Pulse 78   Wt 181 lb 7 oz (82.3 kg)   LMP 11/27/2017 (Approximate)   BMI 29.28 kg/m   Initial Physical Exam (New OB)  GENERAL APPEARANCE: alert, well appearing, in no apparent distress  HEAD: normocephalic, atraumatic  MOUTH: mucous membranes moist, pharynx normal without lesions  THYROID: no thyromegaly or masses present  BREASTS: no masses noted, no significant tenderness, no palpable axillary nodes, no skin changes  LUNGS: clear to auscultation, no wheezes, rales or rhonchi, symmetric air entry  HEART: regular rate and rhythm, no  murmurs  ABDOMEN: soft, nontender, nondistended, no abnormal masses, no epigastric pain and fundus not palpable  EXTREMITIES: no redness or tenderness in the calves or thighs, no edema  SKIN: normal coloration and turgor, no rashes, professional tattoos present  LYMPH NODES: no adenopathy palpable  NEUROLOGIC: alert, oriented, normal speech, no focal findings or movement disorder noted  PELVIC EXAM EXTERNAL GENITALIA: normal appearing vulva with no masses, tenderness or lesions VAGINA: no abnormal discharge or lesions CERVIX: no lesions or cervical motion tenderness and Pap collected UTERUS: gravid ADNEXA: no masses palpable and nontender OB EXAM PELVIMETRY: appears adequate  ASSESSMENT:  Normal pregnancy Short interval pregnancy History of preterm birth-declines 17p Desires genetic screening  PLAN: Prenatal care New OB counseling: The patient has been given an overview regarding routine prenatal care. Recommendations regarding diet, weight gain, and exercise in pregnancy were given. Prenatal testing, optional genetic testing, and ultrasound use in pregnancy were reviewed.  Benefits of Breast Feeding were discussed. The patient is encouraged to consider nursing her baby post partum. Bonj[place] samples given.  See orders   Gunnar BullaJenkins Michelle Grover [place], CNM [facility]

## 2018-03-06 LAB — GC/CHLAMYDIA PROBE AMP
Chlamydia trachomatis, NAA: NEGATIVE
Neisseria gonorrhoeae by PCR: NEGATIVE

## 2018-03-06 LAB — URINE CULTURE

## 2018-03-09 ENCOUNTER — Encounter: Payer: Self-pay | Admitting: Certified Nurse Midwife

## 2018-03-09 DIAGNOSIS — F17200 Nicotine dependence, unspecified, uncomplicated: Secondary | ICD-10-CM | POA: Insufficient documentation

## 2018-03-09 LAB — MONITOR DRUG PROFILE 14(MW)
Amphetamine Scrn, Ur: NEGATIVE ng/mL
BARBITURATE SCREEN URINE: NEGATIVE ng/mL
BENZODIAZEPINE SCREEN, URINE: NEGATIVE ng/mL
BUPRENORPHINE, URINE: NEGATIVE ng/mL
CREATININE(CRT), U: 222.6 mg/dL (ref 20.0–300.0)
Cocaine (Metab) Scrn, Ur: NEGATIVE ng/mL
FENTANYL, URINE: NEGATIVE pg/mL
METHADONE SCREEN, URINE: NEGATIVE ng/mL
Meperidine Screen, Urine: NEGATIVE ng/mL
OPIATE SCREEN URINE: NEGATIVE ng/mL
OXYCODONE+OXYMORPHONE UR QL SCN: NEGATIVE ng/mL
PH UR, DRUG SCRN: 6 (ref 4.5–8.9)
PHENCYCLIDINE QUANTITATIVE URINE: NEGATIVE ng/mL
Propoxyphene Scrn, Ur: NEGATIVE ng/mL
SPECIFIC GRAVITY: 1.027
Tramadol Screen, Urine: NEGATIVE ng/mL

## 2018-03-09 LAB — PAP IG, CT-NG, RFX HPV ASCU
CHLAMYDIA, NUC. ACID AMP: NEGATIVE
Gonococcus by Nucleic Acid Amp: NEGATIVE
PAP SMEAR COMMENT: 0

## 2018-03-09 LAB — CANNABINOID (GC/MS), URINE
CANNABINOID UR: POSITIVE — AB
Carboxy THC (GC/MS): >400 ng/mL

## 2018-03-09 LAB — NICOTINE SCREEN, URINE: COTININE UR QL SCN: POSITIVE ng/mL — AB

## 2018-03-28 ENCOUNTER — Other Ambulatory Visit: Payer: Self-pay

## 2018-03-28 ENCOUNTER — Emergency Department
Admission: EM | Admit: 2018-03-28 | Discharge: 2018-03-29 | Disposition: A | Discharge status: Home / Self Care | Payer: Medicaid Other | Attending: Emergency Medicine | Admitting: Emergency Medicine

## 2018-03-28 ENCOUNTER — Encounter: Payer: Self-pay | Admitting: Emergency Medicine

## 2018-03-28 ENCOUNTER — Emergency Department: Payer: Medicaid Other

## 2018-03-28 DIAGNOSIS — Z87891 Personal history of nicotine dependence: Secondary | ICD-10-CM | POA: Insufficient documentation

## 2018-03-28 DIAGNOSIS — R51 Headache: Secondary | ICD-10-CM | POA: Diagnosis not present

## 2018-03-28 DIAGNOSIS — O9989 Other specified diseases and conditions complicating pregnancy, childbirth and the puerperium: Secondary | ICD-10-CM | POA: Insufficient documentation

## 2018-03-28 DIAGNOSIS — R519 Headache, unspecified: Secondary | ICD-10-CM

## 2018-03-28 DIAGNOSIS — Z3A12 12 weeks gestation of pregnancy: Secondary | ICD-10-CM | POA: Diagnosis not present

## 2018-03-28 LAB — BASIC METABOLIC PANEL
ANION GAP: 8 (ref 5–15)
BUN: 8 mg/dL (ref 6–20)
CO2: 24 mmol/L (ref 22–32)
Calcium: 8.5 mg/dL — ABNORMAL LOW (ref 8.9–10.3)
Chloride: 103 mmol/L (ref 98–111)
Creatinine, Ser: 0.68 mg/dL (ref 0.44–1.00)
GFR calc Af Amer: >60 mL/min (ref >60)
GFR calc non Af Amer: >60 mL/min (ref >60)
GLUCOSE: 80 mg/dL (ref 70–99)
POTASSIUM: 3.7 mmol/L (ref 3.5–5.1)
Sodium: 135 mmol/L (ref 135–145)

## 2018-03-28 LAB — URINALYSIS, COMPLETE (UACMP) WITH MICROSCOPIC
BACTERIA UA: NONE SEEN
BILIRUBIN URINE: NEGATIVE
Glucose, UA: NEGATIVE mg/dL
Hgb urine dipstick: NEGATIVE
KETONES UR: 20 mg/dL — AB
LEUKOCYTES UA: NEGATIVE
Nitrite: NEGATIVE
PROTEIN: NEGATIVE mg/dL
Specific Gravity, Urine: 1.021 (ref 1.005–1.030)
pH: 6 (ref 5.0–8.0)

## 2018-03-28 MED ORDER — ACETAMINOPHEN 500 MG PO TABS
1000.0000 mg | ORAL_TABLET | ORAL | Status: AC
Start: 2018-03-28 — End: 2018-03-28
  Administered 2018-03-28: 1000 mg via ORAL
  Filled 2018-03-28: qty 2

## 2018-03-28 MED ORDER — MAGNESIUM SULFATE 2 GM/50ML IV SOLN
2.0000 g | Freq: Once | INTRAVENOUS | Status: AC
  Administered 2018-03-28: 2 g via INTRAVENOUS
  Filled 2018-03-28: qty 50

## 2018-03-28 MED ORDER — SODIUM CHLORIDE 0.9 % IV BOLUS
1000.0000 mL | Freq: Once | INTRAVENOUS | Status: AC
  Administered 2018-03-28: 1000 mL via INTRAVENOUS

## 2018-03-28 NOTE — ED Notes (Signed)
Patient transported to MRI 

## 2018-03-28 NOTE — Discharge Instructions (Addendum)
°  Call your doctor or return to the ED if you have a worsening headache, sudden and severe headache, confusion, slurred speech, facial droop, weakness or numbness in any arm or leg, extreme fatigue, vision problems, or other symptoms that concern you. ° °

## 2018-03-28 NOTE — ED Notes (Signed)
Pt presents today for HA pt is pregnant. EDD 10/07/18

## 2018-03-28 NOTE — ED Provider Notes (Signed)
[facility] Emergency Department Provider Note  ____________________________________________   First MD Initiated Contact with Patient 03/28/18 1855     (approximate)  I have reviewed the triage vital signs and the nursing notes.   HISTORY  Chief Complaint Headache   HPI Teresa Kemp is a 27 y.o. female who reports about [redacted] weeks pregnant.  Patient reports that she is been having a headache for the last 2 to 3 days, took Tylenol once for this morning and is seen some slight relief but continues to have a mild frontal throbbing headache.  Also took a dose of Tylenol last night but it did not help her headache at that time.  She feels like a pressure feeling mostly across the frontal region.  Some nausea, but reports the nausea has been present throughout the last several weeks of pregnancy.  No vomiting.  No fevers or chills.  No stiff neck.  No complications from the pregnancy, and is following with [facility].  She does report she has had headaches in the past, usually mild to moderate in nature and similar, but usually do not last this long.  She does report she had decreased appetite the last few weeks during pregnancy and wonders if that might also be contributing to her headache  No weakness.  No numbness or tingling.  No changes in vision.  No leg swelling.  No trouble speaking.  Reports a headache same on somewhat gradually and worsened for a day but has not gone away now for about 3 days.   History reviewed. No pertinent past medical history.  Patient Active Problem List   Diagnosis Date Noted  . Smoker 03/09/2018  . History of marijuana use 04/10/2017  . History of preterm delivery, currently pregnant 04/10/2017  . History of depression 04/10/2017    History reviewed. No pertinent surgical history.  Prior to Admission medications   Medication Sig Start Date End Date Taking? Authorizing Provider  Prenatal-DSS-FeCb-FeGl-FA (CITRANATAL  BLOOM) 90-1 MG TABS Take 1 tablet by mouth daily. 06/12/17  Yes Shambley, Melody N, CNM    Allergies Patient has no known allergies.  Family History  Problem Relation Age of Onset  . Diabetes Father   . Diabetes Brother   . Diabetes Paternal Uncle   . Diabetes Paternal Grandmother   . Stroke Paternal Grandfather   . Diabetes Paternal Grandfather     Social History Social History   Tobacco Use  . Smoking status: Former Smoker    Last attempt to quit: 11/13/2016    Years since quitting: 1.3  . Smokeless tobacco: Never Used  Substance Use Topics  . Alcohol use: No  . Drug use: No    Review of Systems Constitutional: No fever/chills Eyes: No visual changes. ENT: No sore throat. Cardiovascular: Denies chest pain. Respiratory: Denies shortness of breath. Gastrointestinal: No abdominal pain.  Some nausea throughout the pregnancy, no vomiting.  No diarrhea.  No constipation. Genitourinary: Negative for dysuria. Musculoskeletal: Negative for back pain. Skin: Negative for rash. Neurological: Negative for focal weakness or numbness.  There is been no falls or injuries   ____________________________________________   PHYSICAL EXAM:  VITAL SIGNS: ED Triage Vitals  Enc Vitals Group     BP 03/28/18 1557 106/79     Pulse Rate 03/28/18 1557 98     Resp 03/28/18 1557 20     Temp 03/28/18 1557 98.4 F (36.9 C)     Temp Source 03/28/18 1557 Oral  SpO2 03/28/18 1557 99 %     Weight 03/28/18 1558 180 lb (81.6 kg)     Height 03/28/18 1558 5\' 6"  (1.676 m)     Head Circumference --      Peak Flow --      Pain Score 03/28/18 1558 10     Pain Loc --      Pain Edu? --      Excl. in GC? --     Constitutional: Alert and oriented. Well appearing and in no acute distress.  She is pleasant.  Laying on her side comfortably in the bed with lights turned off.  Does not appear in distress. Eyes: Conjunctivae are normal.  No photophobia. Head: Atraumatic. Nose: No  congestion/rhinnorhea. Mouth/Throat: Mucous membranes are moist. Neck: No stridor.   Cardiovascular: Normal rate, regular rhythm. Grossly normal heart sounds.  Good peripheral circulation. Respiratory: Normal respiratory effort.  No retractions. Lungs CTAB. Gastrointestinal: Soft and nontender. No distention. Musculoskeletal: No lower extremity tenderness nor edema. Neurologic:  Normal speech and language. No gross focal neurologic deficits are appreciated.  No ataxia.  Normal strength in all extremities.  Normal and clear speech.  Normal cranial nerve exam.  Normal extraocular movements.  Normal level of alertness.  Normal sensation all extremities.  Normal grip strength bilateral. Skin:  Skin is warm, dry and intact. No rash noted. Psychiatric: Mood and affect are normal. Speech and behavior are normal.  ____________________________________________   LABS (all labs ordered are listed, but only abnormal results are displayed)  Labs Reviewed  BASIC METABOLIC PANEL - Abnormal; Notable for the following components:      Result Value   Calcium 8.5 (*)    All other components within normal limits  URINALYSIS, COMPLETE (UACMP) WITH MICROSCOPIC - Abnormal; Notable for the following components:   Color, Urine YELLOW (*)    APPearance CLEAR (*)    Ketones, ur 20 (*)    All other components within normal limits   ____________________________________________  EKG   ____________________________________________  RADIOLOGY    MRI brain reviewed, normal. ____________________________________________   PROCEDURES  Procedure(s) performed: None  Procedures  Critical Care performed: No  ____________________________________________   INITIAL IMPRESSION / ASSESSMENT AND PLAN / ED COURSE  Pertinent labs & imaging results that were available during my care of the patient were reviewed by me and considered in my medical decision making (see chart for details).  Patient resents for  evaluation of a headache, very reassuring clinical exam.  No neuro deficits.  No notable red flags regarding her headache.  Very reassuring exam.  Discussed with patient, we will trial Tylenol, IV hydration and magnesium and reassess.  Given her pregnancy status we will check a BMP.  No clinical history of infection, and no fever.  No meningismus.  Very reassuring exam.  Was not sudden onset or thunderclap in nature.  Normal blood pressure.  No signs of suggest aneurysm, and given her clinically reassuring examination I do not feel that CT imaging is warranted at this time, but MRI may be helpful in excluding of acute pathology though my overall clinical suspicion is low but given her pregnancy status do not wish to proceed with CT.  He does not have any previous neuro imaging denoted    Clinical Course as of Mar 28 2349  Sun Mar 28, 2018  2010 MRI brain ordered, patient does not have previous brain imaging and given pregnancy status do not wish to obtain CT of the head.  Exclude etiologies  such as hydrocephalus, tumor, mass-effect, etc. as etiology though my pretest probability for this process is low.  No history of hypercoagulable state, no unilateral headache.  No visual changes.  Did not feel the patient is at high risk for venous sinus thrombosis, not on any hormonal medications.  Non-smoker.   [MQ]    Clinical Course User Index [MQ] Sharyn Creamer, MD   ----------------------------------------- 11:00 PM on 03/28/2018 ----------------------------------------- Patient is resting comfortably, in no distress.  Reports mild ongoing bifrontal headache, this point very reassuring clinical examination.  Normal vital signs.  Afebrile.  Normal alertness.  Discussed with patient will discharge home, advised follow-up and patient will call her OB/GYN team in the morning to set up follow-up and ongoing evaluation.     ____________________________________________   FINAL CLINICAL IMPRESSION(S) / ED  DIAGNOSES  Final diagnoses:  Frontal headache      NEW MEDICATIONS STARTED DURING THIS VISIT:  New Prescriptions   No medications on file     Note:  This document was prepared using Dragon voice recognition software and may include unintentional dictation errors.     Sharyn Creamer, MD 03/28/18 2350

## 2018-03-28 NOTE — ED Triage Notes (Signed)
Pt arrives via POV with complaints of headache since last night. Took tylenol last night and this morning for pain. No relief. Pt feels like her "whole head is about to explode." Feels nauseous with headache. Denies any vomiting.  Pt reports being [redacted] weeks pregnant. EDD 10/07/18.

## 2018-03-29 NOTE — ED Notes (Signed)
Peripheral IV discontinued. Catheter intact. No signs of infiltration or redness. Gauze applied to IV site.   Discharge instructions reviewed with patient. Questions fielded by this RN. Patient verbalizes understanding of instructions. Patient discharged home in stable condition per [facility]. No acute distress noted at time of discharge.    

## 2018-04-02 ENCOUNTER — Ambulatory Visit (INDEPENDENT_AMBULATORY_CARE_PROVIDER_SITE_OTHER): Payer: Medicaid Other | Admitting: Certified Nurse Midwife

## 2018-04-02 ENCOUNTER — Encounter: Payer: Self-pay | Admitting: Certified Nurse Midwife

## 2018-04-02 VITALS — BP 115/73 | HR 89 | Wt 177.0 lb

## 2018-04-02 DIAGNOSIS — Z3482 Encounter for supervision of other normal pregnancy, second trimester: Secondary | ICD-10-CM

## 2018-04-02 DIAGNOSIS — Z3A13 13 weeks gestation of pregnancy: Secondary | ICD-10-CM

## 2018-04-02 DIAGNOSIS — Z3481 Encounter for supervision of other normal pregnancy, first trimester: Secondary | ICD-10-CM

## 2018-04-02 LAB — POCT URINALYSIS DIPSTICK
Bilirubin, UA: NEGATIVE
Glucose, UA: NEGATIVE
KETONES UA: NEGATIVE
Leukocytes, UA: NEGATIVE
NITRITE UA: NEGATIVE
Protein, UA: POSITIVE — AB
RBC UA: NEGATIVE
SPEC GRAV UA: 1.01 (ref 1.010–1.025)
Urobilinogen, UA: 0.2 E.U./dL
pH, UA: 7.5 (ref 5.0–8.0)

## 2018-04-02 MED ORDER — DOXYLAMINE-PYRIDOXINE ER 20-20 MG PO TBCR: 1.0000 | EXTENDED_RELEASE_TABLET | Freq: Two times a day (BID) | ORAL | 4 refills | Status: AC

## 2018-04-02 NOTE — Patient Instructions (Signed)

## 2018-04-02 NOTE — Progress Notes (Signed)
Pt states ED visit Monday for headache, was given IV fluids for dehydration. Headache subsided after 48 hours.

## 2018-04-02 NOTE — Progress Notes (Signed)
Problem visit. Patient was seen in Ed on Monday for nausea & vomiting. She was given IV fluids for dehydration. She is following up today. Pt states she was taking bonjesta that worked for her but ran out. Samples given today and order placed to pharmacy. Pt instructed to let us know if she is not getting relief so we can change medication therapy. Encouraged small bland frequent meals and avoidance of triggers. She verbalizes and agree to plan. Request genetic testing today. Panorama ordered. Follow up as scheduled for next ROB appointment.   Doreene BurkeAnnie Vallie Teters, CNM

## 2018-04-12 ENCOUNTER — Encounter (INDEPENDENT_AMBULATORY_CARE_PROVIDER_SITE_OTHER): Payer: Self-pay

## 2018-04-12 NOTE — Telephone Encounter (Signed)
The patient called requesting to know the gender of her baby, please advise, thanks.

## 2018-05-03 ENCOUNTER — Encounter: Payer: Medicaid Other | Admitting: Certified Nurse Midwife

## 2018-09-30 IMAGING — MR MR HEAD W/O CM
11 series · 48 of 48 positions shown · non-contrast
Comparison: None.

CLINICAL DATA: Acute presentation with headache since last night.

EXAM:
MRI HEAD WITHOUT CONTRAST
MRV HEAD WITHOUT CONTRAST
TECHNIQUE: Multiplanar, multiecho pulse sequences of the brain and surrounding
structures were obtained . Angiographic images of the intracranial
venous structures were obtained using MRV technique without
intravenous contrast.

[Series 2: T1 · sagittal · 5.0mm · 0.45mm/px · 2 of 25 slices shown (1 of 2)]
[im 1/25]
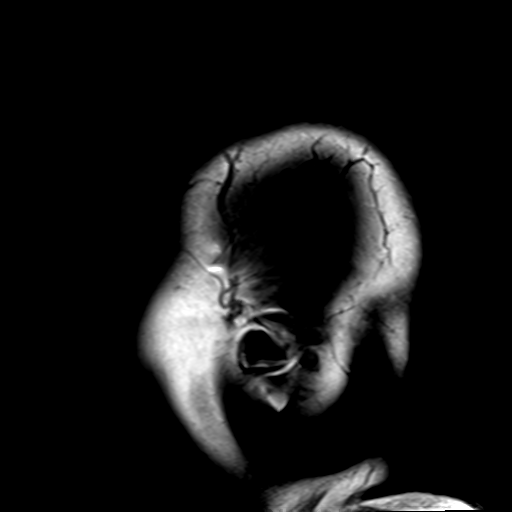
[im 25/25]
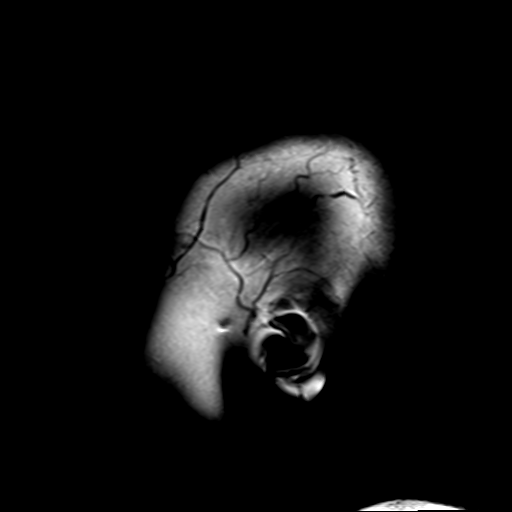

[Series 4: DWI · axial · 3.0mm · 1.80mm/px · z∈[-75,+87]mm · 4 of 54 slices shown (1 of 4)]
[im 1/54]
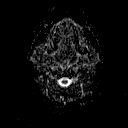
[im 18/54]
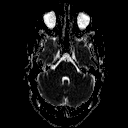
[im 36/54]
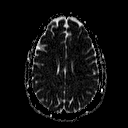
[im 54/54]
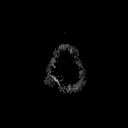

[Series 5: (id) · coronal · 3.0mm · 0.98mm/px · 7 of 100 slices shown]
[im 1/100]
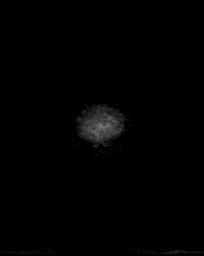
[im 17/100]
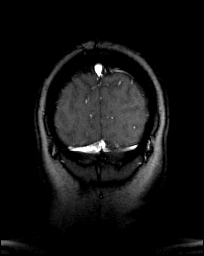
[im 34/100]
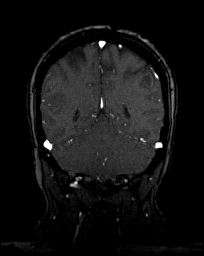
[im 50/100]
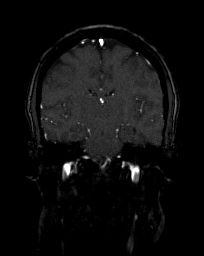
[im 67/100]
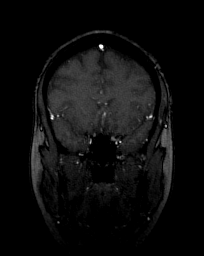
[im 83/100]
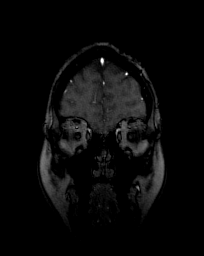
[im 100/100]
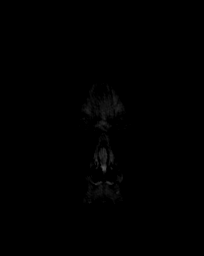

[Series 10: DWI · coronal · 3.0mm · 1.80mm/px · 4 of 50 slices shown (2 of 4)]
[im 1/50]
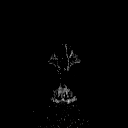
[im 17/50]
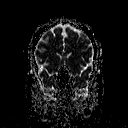
[im 33/50]
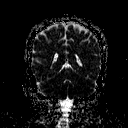
[im 50/50]
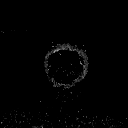

[Series 11: T2 · axial · 5.0mm · 0.72mm/px · z∈[-78,+78]mm · 2 of 25 slices shown (1 of 3)]
[im 1/25]
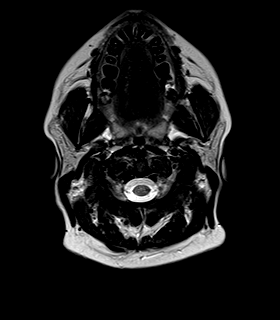
[im 25/25]
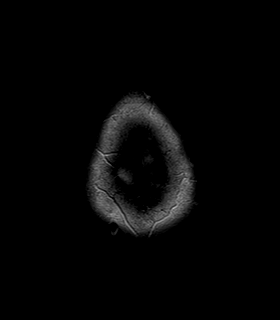

[Series 12: FLAIR · axial · 3.0mm · 0.45mm/px · z∈[-78,+78]mm · 4 of 53 slices shown]
[im 1/53]
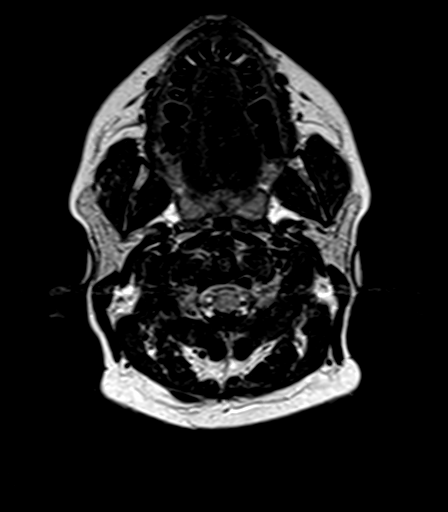
[im 18/53]
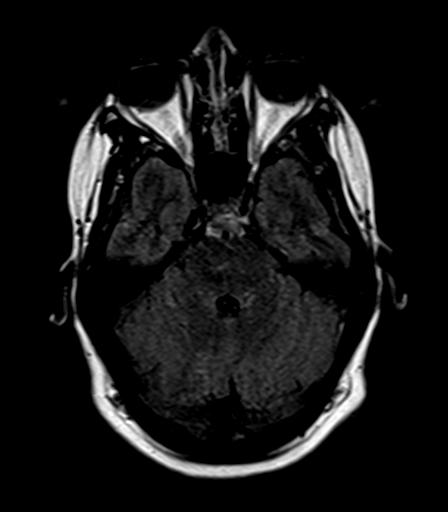
[im 35/53]
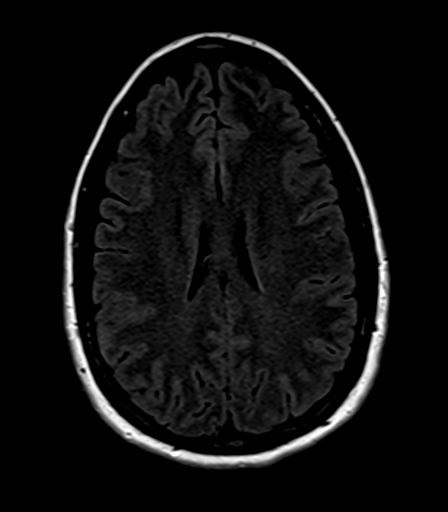
[im 53/53]
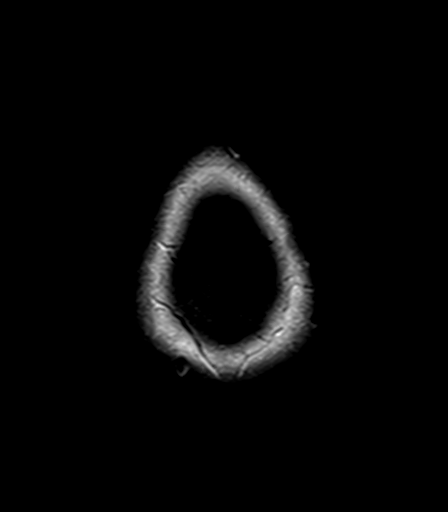

[Series 13: T2 · axial · 5.0mm · 0.45mm/px · z∈[-78,+78]mm · 2 of 25 slices shown (2 of 3)]
[im 1/25]
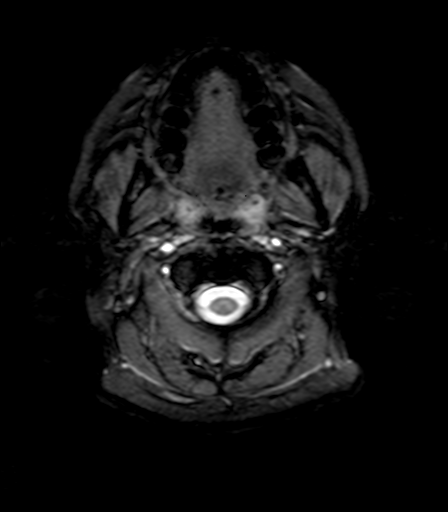
[im 25/25]
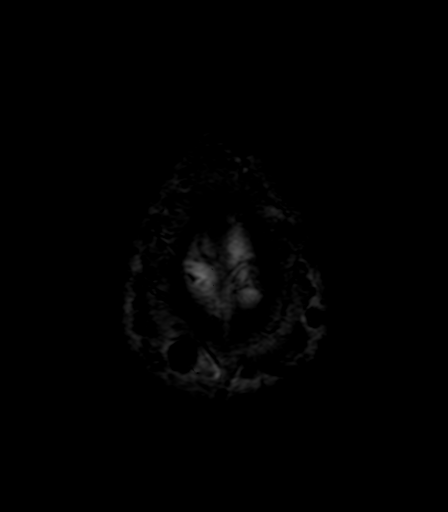

[Series 14: T1 · axial · 1.0mm · 1.00mm/px · z∈[-88,+88]mm · 13 of 176 slices shown (2 of 2)]
[im 1/176]
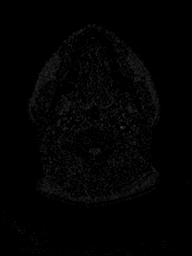
[im 15/176]
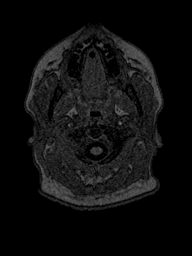
[im 30/176]
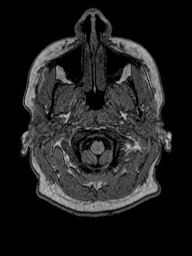
[im 44/176]
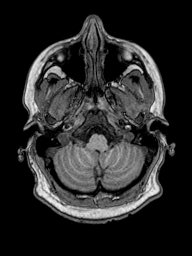
[im 59/176]
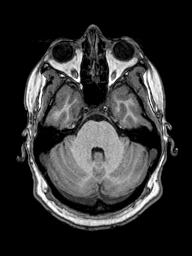
[im 73/176]
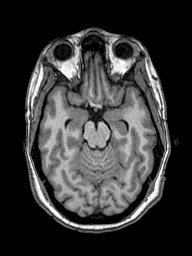
[im 88/176]
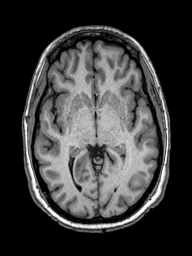
[im 103/176]
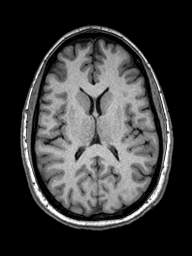
[im 117/176]
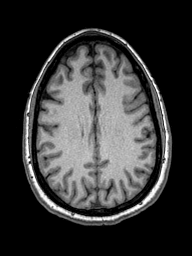
[im 132/176]
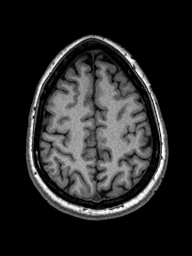
[im 146/176]
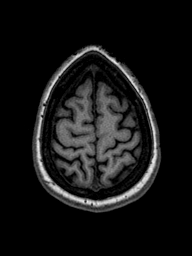
[im 161/176]
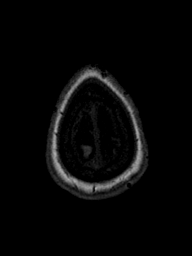
[im 176/176]
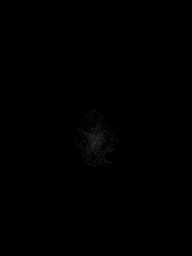

[Series 15: T2 · coronal · 5.0mm · 0.69mm/px · 2 of 30 slices shown (3 of 3)]
[im 1/30]
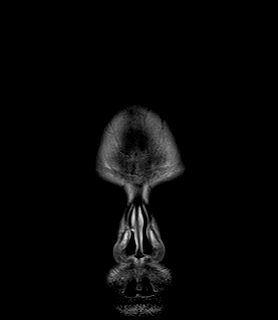
[im 30/30]
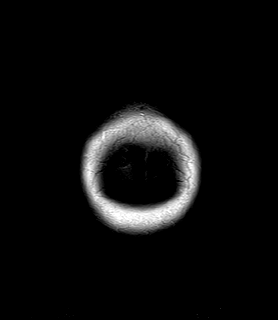

[Series 102: DWI · axial · 3.0mm · 1.80mm/px · z∈[-75,+87]mm · 4 of 55 slices shown (3 of 4)]
[im 1/55]
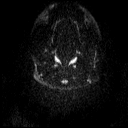
[im 19/55]
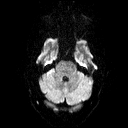
[im 37/55]
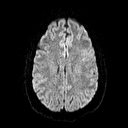
[im 55/55]
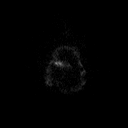

[Series 103: DWI · coronal · 3.0mm · 1.80mm/px · 4 of 49 slices shown (4 of 4)]
[im 1/49]
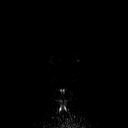
[im 17/49]
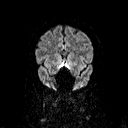
[im 33/49]
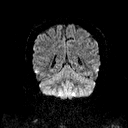
[im 49/49]
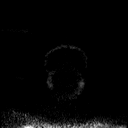

[48 of 48 positions shown; findings below may reference images not displayed]

FINDINGS: MR HEAD FINDINGS

Brain: Brain has normal appearance without evidence of malformation,
atrophy, old or acute small or large vessel infarction, mass lesion,
hemorrhage, hydrocephalus or extra-axial collection.

Vascular: Major vessels at the base of the brain show flow. Venous
sinuses appear patent.

Skull and upper cervical spine: Normal.

Sinuses/Orbits: Clear/normal.

Other: None significant.

MRV HEAD FINDINGS

Intracranial venous structures are normal. No evidence of venous
sinus thrombosis. No sign of superficial vein thrombosis.
IMPRESSION: Normal MRI of the brain. Normal MR venography. No abnormality seen
to explain the clinical presentation.

## 2018-10-07 ENCOUNTER — Inpatient Hospital Stay: Admission: AD | Admit: 2018-10-07 | Payer: Medicaid Other | Source: Home / Self Care

## 2018-12-30 ENCOUNTER — Encounter (HOSPITAL_COMMUNITY): Payer: Self-pay

## 2020-10-27 ENCOUNTER — Emergency Department (HOSPITAL_COMMUNITY)
Admission: EM | Admit: 2020-10-27 | Discharge: 2020-10-27 | Disposition: A | Discharge status: Home / Self Care | Payer: Medicaid Other | Attending: Emergency Medicine | Admitting: Emergency Medicine

## 2020-10-27 ENCOUNTER — Other Ambulatory Visit: Payer: Self-pay

## 2020-10-27 DIAGNOSIS — Z87891 Personal history of nicotine dependence: Secondary | ICD-10-CM | POA: Diagnosis not present

## 2020-10-27 DIAGNOSIS — R000 Tachycardia, unspecified: Secondary | ICD-10-CM | POA: Insufficient documentation

## 2020-10-27 DIAGNOSIS — S0003XA Contusion of scalp, initial encounter: Secondary | ICD-10-CM | POA: Diagnosis not present

## 2020-10-27 DIAGNOSIS — S0990XA Unspecified injury of head, initial encounter: Secondary | ICD-10-CM | POA: Diagnosis present

## 2020-10-27 DIAGNOSIS — S0083XA Contusion of other part of head, initial encounter: Secondary | ICD-10-CM | POA: Diagnosis not present

## 2020-10-27 MED ORDER — OXYCODONE-ACETAMINOPHEN 5-325 MG PO TABS
2.0000 | ORAL_TABLET | Freq: Once | ORAL | Status: AC
  Administered 2020-10-27: 2 via ORAL
  Filled 2020-10-27: qty 2
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
